# Patient Record
Sex: Female | Born: 1970 | Race: White | Hispanic: No | Marital: Married | Smoking: Never smoker
Health system: Southern US, Community
[De-identification: ages and names within clinical notes are randomized; demographics above are authoritative.]

## PROBLEM LIST (undated history)

## (undated) DIAGNOSIS — I209 Angina pectoris, unspecified: Secondary | ICD-10-CM

## (undated) DIAGNOSIS — E039 Hypothyroidism, unspecified: Secondary | ICD-10-CM

## (undated) DIAGNOSIS — I219 Acute myocardial infarction, unspecified: Secondary | ICD-10-CM

## (undated) DIAGNOSIS — R112 Nausea with vomiting, unspecified: Secondary | ICD-10-CM

## (undated) DIAGNOSIS — I2510 Atherosclerotic heart disease of native coronary artery without angina pectoris: Secondary | ICD-10-CM

## (undated) DIAGNOSIS — N921 Excessive and frequent menstruation with irregular cycle: Secondary | ICD-10-CM

## (undated) DIAGNOSIS — Z9889 Other specified postprocedural states: Secondary | ICD-10-CM

## (undated) DIAGNOSIS — E785 Hyperlipidemia, unspecified: Secondary | ICD-10-CM

## (undated) HISTORY — PX: TONSILLECTOMY: SUR1361

## (undated) HISTORY — PX: HYSTEROSCOPY W/ ENDOMETRIAL ABLATION: SUR665

## (undated) HISTORY — PX: KNEE ARTHROSCOPY: SUR90

---

## 2013-08-02 HISTORY — PX: CORONARY ANGIOPLASTY: SHX604

## 2013-11-30 DIAGNOSIS — I209 Angina pectoris, unspecified: Secondary | ICD-10-CM

## 2013-11-30 HISTORY — DX: Angina pectoris, unspecified: I20.9

## 2014-06-07 ENCOUNTER — Other Ambulatory Visit: Payer: Self-pay

## 2014-06-07 DIAGNOSIS — Z1231 Encounter for screening mammogram for malignant neoplasm of breast: Secondary | ICD-10-CM

## 2014-07-05 ENCOUNTER — Ambulatory Visit
Admission: RE | Admit: 2014-07-05 | Discharge: 2014-07-05 | Disposition: A | Discharge status: Home / Self Care | Payer: BC Managed Care – PPO | Source: Ambulatory Visit

## 2014-07-05 DIAGNOSIS — Z1231 Encounter for screening mammogram for malignant neoplasm of breast: Secondary | ICD-10-CM

## 2015-12-15 ENCOUNTER — Other Ambulatory Visit: Payer: Self-pay

## 2015-12-15 DIAGNOSIS — Z139 Encounter for screening, unspecified: Secondary | ICD-10-CM

## 2015-12-26 ENCOUNTER — Ambulatory Visit
Admission: RE | Admit: 2015-12-26 | Discharge: 2015-12-26 | Disposition: A | Discharge status: Home / Self Care | Payer: BLUE CROSS/BLUE SHIELD | Source: Ambulatory Visit

## 2015-12-26 DIAGNOSIS — Z139 Encounter for screening, unspecified: Secondary | ICD-10-CM

## 2016-04-15 ENCOUNTER — Other Ambulatory Visit: Payer: Self-pay | Admitting: Sports Medicine

## 2016-04-15 DIAGNOSIS — M25552 Pain in left hip: Secondary | ICD-10-CM

## 2016-04-29 ENCOUNTER — Ambulatory Visit: Payer: Self-pay | Admitting: Orthopedic Surgery

## 2016-04-29 NOTE — Progress Notes (Signed)
Pt is being scheduled for preop appt; please place surgical orders in epic. Thanks.  

## 2016-05-04 ENCOUNTER — Encounter (HOSPITAL_COMMUNITY)
Admission: RE | Admit: 2016-05-04 | Discharge: 2016-05-04 | Disposition: A | Discharge status: Home / Self Care | Payer: BLUE CROSS/BLUE SHIELD | Source: Ambulatory Visit | Attending: Orthopedic Surgery | Admitting: Orthopedic Surgery

## 2016-05-04 ENCOUNTER — Encounter (HOSPITAL_COMMUNITY): Payer: Self-pay

## 2016-05-04 DIAGNOSIS — Z7982 Long term (current) use of aspirin: Secondary | ICD-10-CM | POA: Diagnosis not present

## 2016-05-04 DIAGNOSIS — I2510 Atherosclerotic heart disease of native coronary artery without angina pectoris: Secondary | ICD-10-CM | POA: Diagnosis not present

## 2016-05-04 DIAGNOSIS — E039 Hypothyroidism, unspecified: Secondary | ICD-10-CM | POA: Diagnosis not present

## 2016-05-04 DIAGNOSIS — S76012A Strain of muscle, fascia and tendon of left hip, initial encounter: Secondary | ICD-10-CM | POA: Diagnosis not present

## 2016-05-04 DIAGNOSIS — X58XXXA Exposure to other specified factors, initial encounter: Secondary | ICD-10-CM | POA: Diagnosis not present

## 2016-05-04 DIAGNOSIS — Z7902 Long term (current) use of antithrombotics/antiplatelets: Secondary | ICD-10-CM | POA: Diagnosis not present

## 2016-05-04 DIAGNOSIS — I252 Old myocardial infarction: Secondary | ICD-10-CM | POA: Diagnosis not present

## 2016-05-04 DIAGNOSIS — Z955 Presence of coronary angioplasty implant and graft: Secondary | ICD-10-CM | POA: Diagnosis not present

## 2016-05-04 DIAGNOSIS — E785 Hyperlipidemia, unspecified: Secondary | ICD-10-CM | POA: Diagnosis not present

## 2016-05-04 HISTORY — DX: Excessive and frequent menstruation with irregular cycle: N92.1

## 2016-05-04 HISTORY — DX: Hypothyroidism, unspecified: E03.9

## 2016-05-04 HISTORY — DX: Angina pectoris, unspecified: I20.9

## 2016-05-04 HISTORY — DX: Atherosclerotic heart disease of native coronary artery without angina pectoris: I25.10

## 2016-05-04 HISTORY — DX: Hyperlipidemia, unspecified: E78.5

## 2016-05-04 HISTORY — DX: Acute myocardial infarction, unspecified: I21.9

## 2016-05-04 LAB — HCG, SERUM, QUALITATIVE: Preg, Serum: NEGATIVE

## 2016-05-04 LAB — ABO/RH: ABO/RH(D): O POS

## 2016-05-04 NOTE — Patient Instructions (Addendum)
Claire Briggs  05/04/2016   Your procedure is scheduled on: 05/06/16 Report to [facility]  Entrance take [facility]  elevators to 3rd floor to  [place] at 1:45 PM  Call this number if you have problems the morning of surgery [phone]   Remember: ONLY 1 PERSON MAY GO WITH YOU TO SHORT STAY TO GET  READY MORNING OF YOUR SURGERY.  Do not eat food :After Midnight.--- MAY HAVE CLEAR LIQUIDS MORNING OF SURGERY UNTIL 0945 AM--THEN NOTHING BY MOUTH     Take these medicines the morning of surgery with A SIP OF WATER: Levothyroxine May take Nitroglycerin if needed DO NOT TAKE ANY DIABETIC MEDICATIONS DAY OF YOUR SURGERY                               You may not have any metal on your body including hair pins and              piercings  Do not wear jewelry, make-up, lotions, powders or perfumes, deodorant             Do not wear nail polish.  Do not shave  48 hours prior to surgery.              Men may shave face and neck.   Do not bring valuables to the hospital. [place].  Contacts, dentures or bridgework may not be worn into surgery.  Leave suitcase in the car. After surgery it may be brought to your room.   _____________________________________________________________________             [facility] - Preparing for Surgery Before surgery, you can play an important role.  Because skin is not sterile, your skin needs to be as free of germs as possible.  You can reduce the number of germs on your skin by washing with CHG (chlorahexidine gluconate) soap before surgery.  CHG is an antiseptic cleaner which kills germs and bonds with the skin to continue killing germs even after washing. Please DO NOT use if you have an allergy to CHG or antibacterial soaps.  If your skin becomes reddened/irritated stop using the CHG and inform your nurse when you arrive at Short Stay. Do not shave (including legs and underarms)  for at least 48 hours prior to the first CHG shower.  You may shave your face/neck. Please follow these instructions carefully:  1.  Shower with CHG Soap the night before surgery and the  morning of Surgery.  2.  If you choose to wash your hair, wash your hair first as usual with your  normal  shampoo.  3.  After you shampoo, rinse your hair and body thoroughly to remove the  shampoo.                           4.  Use CHG as you would any other liquid soap.  You can apply chg directly  to the skin and wash                       Gently with a scrungie or clean washcloth.  5.  Apply the CHG Soap to your body  ONLY FROM THE NECK DOWN.   Do not use on face/ open                           Wound or open sores. Avoid contact with eyes, ears mouth and genitals (private parts).                       Wash face,  Genitals (private parts) with your normal soap.             6.  Wash thoroughly, paying special attention to the area where your surgery  will be performed.  7.  Thoroughly rinse your body with warm water from the neck down.  8.  DO NOT shower/wash with your normal soap after using and rinsing off  the CHG Soap.                9.  Pat yourself dry with a clean towel.            10.  Wear clean pajamas.            11.  Place clean sheets on your bed the night of your first shower and do not  sleep with pets. Day of Surgery : Do not apply any lotions/deodorants the morning of surgery.  Please wear clean clothes to the hospital/surgery center.  FAILURE TO FOLLOW THESE INSTRUCTIONS MAY RESULT IN THE CANCELLATION OF YOUR SURGERY PATIENT SIGNATURE_________________________________  NURSE SIGNATURE__________________________________  ________________________________________________________________________    CLEAR LIQUID DIET   Foods Allowed                                                                     Foods Excluded  Coffee and tea, regular and decaf                             liquids  that you cannot  Plain Jell-O in any flavor                                             see through such as: Fruit ices (not with fruit pulp)                                     milk, soups, orange juice  Iced Popsicles                                    All solid food Carbonated beverages, regular and diet                                    Cranberry, grape and apple juices Sports drinks like Gatorade Lightly seasoned clear broth or consume(fat free) Sugar, honey syrup  Sample Menu Breakfast  Lunch                                     Supper Cranberry juice                    Beef broth                            Chicken broth Jell-O                                     Grape juice                           Apple juice Coffee or tea                        Jell-O                                      Popsicle                                                Coffee or tea                        Coffee or tea  _____________________________________________________________________

## 2016-05-04 NOTE — Progress Notes (Signed)
Addendum to previous note-  ekg 9/17 chart, requested urgent request from [facility] for resting EKG prior to stress eccho. Cbc with diff 04/23/16, cmp 04/23/16, coagulation studies on chart- delivered by pt.clearance T McAddoo NP, and cardiac clearance on chart

## 2016-05-04 NOTE — Progress Notes (Signed)
LOV with clearance Dr Jimmie Molly on chart, ekg 1/17, stress  eccho 10/16 on chart

## 2016-05-04 NOTE — Progress Notes (Signed)
Resting ekg prior to stress eccho received from [facility] and placed on chart

## 2016-05-06 ENCOUNTER — Encounter (HOSPITAL_COMMUNITY)
Admission: RE | Disposition: A | Discharge status: Home / Self Care | Payer: Self-pay | Source: Ambulatory Visit | Attending: Orthopedic Surgery

## 2016-05-06 ENCOUNTER — Encounter (HOSPITAL_COMMUNITY): Payer: Self-pay | Admitting: *Deleted

## 2016-05-06 ENCOUNTER — Observation Stay (HOSPITAL_COMMUNITY)
Admission: RE | Admit: 2016-05-06 | Discharge: 2016-05-07 | Disposition: A | Discharge status: Home / Self Care | Payer: BLUE CROSS/BLUE SHIELD | Source: Ambulatory Visit | Attending: Orthopedic Surgery | Admitting: Orthopedic Surgery

## 2016-05-06 ENCOUNTER — Ambulatory Visit (HOSPITAL_COMMUNITY): Payer: BLUE CROSS/BLUE SHIELD | Admitting: Anesthesiology

## 2016-05-06 DIAGNOSIS — Z7902 Long term (current) use of antithrombotics/antiplatelets: Secondary | ICD-10-CM | POA: Insufficient documentation

## 2016-05-06 DIAGNOSIS — S76012A Strain of muscle, fascia and tendon of left hip, initial encounter: Secondary | ICD-10-CM | POA: Diagnosis not present

## 2016-05-06 DIAGNOSIS — X58XXXA Exposure to other specified factors, initial encounter: Secondary | ICD-10-CM | POA: Insufficient documentation

## 2016-05-06 DIAGNOSIS — E039 Hypothyroidism, unspecified: Secondary | ICD-10-CM | POA: Insufficient documentation

## 2016-05-06 DIAGNOSIS — I252 Old myocardial infarction: Secondary | ICD-10-CM | POA: Insufficient documentation

## 2016-05-06 DIAGNOSIS — M67952 Unspecified disorder of synovium and tendon, left thigh: Secondary | ICD-10-CM

## 2016-05-06 DIAGNOSIS — I2510 Atherosclerotic heart disease of native coronary artery without angina pectoris: Secondary | ICD-10-CM | POA: Insufficient documentation

## 2016-05-06 DIAGNOSIS — Z7982 Long term (current) use of aspirin: Secondary | ICD-10-CM | POA: Insufficient documentation

## 2016-05-06 DIAGNOSIS — E785 Hyperlipidemia, unspecified: Secondary | ICD-10-CM | POA: Insufficient documentation

## 2016-05-06 DIAGNOSIS — Z955 Presence of coronary angioplasty implant and graft: Secondary | ICD-10-CM | POA: Insufficient documentation

## 2016-05-06 HISTORY — PX: EXCISION/RELEASE BURSA HIP: SHX5014

## 2016-05-06 LAB — TYPE AND SCREEN
ABO/RH(D): O POS
ANTIBODY SCREEN: NEGATIVE

## 2016-05-06 SURGERY — RELEASE, BURSA, TROCHANTERIC
Anesthesia: General | Site: Hip | Laterality: Left

## 2016-05-06 MED ORDER — SCOPOLAMINE 1 MG/3DAYS TD PT72
MEDICATED_PATCH | TRANSDERMAL | Status: AC
  Filled 2016-05-06: qty 1

## 2016-05-06 MED ORDER — BUPIVACAINE HCL (PF) 0.25 % IJ SOLN
INTRAMUSCULAR | Status: AC
  Filled 2016-05-06: qty 30

## 2016-05-06 MED ORDER — CEFAZOLIN SODIUM-DEXTROSE 2-4 GM/100ML-% IV SOLN
INTRAVENOUS | Status: AC
  Filled 2016-05-06: qty 100

## 2016-05-06 MED ORDER — HYDROMORPHONE HCL 1 MG/ML IJ SOLN
0.2500 mg | INTRAMUSCULAR | Status: DC | PRN
  Administered 2016-05-06 (×4): 0.5 mg via INTRAVENOUS

## 2016-05-06 MED ORDER — HYDROCODONE-ACETAMINOPHEN 5-325 MG PO TABS
1.0000 | ORAL_TABLET | ORAL | Status: DC | PRN
  Administered 2016-05-06 – 2016-05-07 (×2): 1 via ORAL
  Administered 2016-05-07: 2 via ORAL
  Administered 2016-05-07: 1 via ORAL
  Administered 2016-05-07: 2 via ORAL
  Filled 2016-05-06: qty 1
  Filled 2016-05-06: qty 2
  Filled 2016-05-06: qty 1
  Filled 2016-05-06 (×3): qty 2

## 2016-05-06 MED ORDER — PROPOFOL 10 MG/ML IV BOLUS
INTRAVENOUS | Status: AC
  Filled 2016-05-06: qty 20

## 2016-05-06 MED ORDER — HYDROMORPHONE HCL 1 MG/ML IJ SOLN
INTRAMUSCULAR | Status: AC
  Administered 2016-05-06: 0.5 mg via INTRAVENOUS
  Filled 2016-05-06: qty 1

## 2016-05-06 MED ORDER — ROCURONIUM BROMIDE 10 MG/ML (PF) SYRINGE
PREFILLED_SYRINGE | INTRAVENOUS | Status: DC | PRN
  Administered 2016-05-06: 50 mg via INTRAVENOUS

## 2016-05-06 MED ORDER — LACTATED RINGERS IV SOLN
INTRAVENOUS | Status: DC | PRN
  Administered 2016-05-06 (×2): via INTRAVENOUS

## 2016-05-06 MED ORDER — LACTATED RINGERS IV SOLN: INTRAVENOUS | Status: DC

## 2016-05-06 MED ORDER — DOCUSATE SODIUM 100 MG PO CAPS
100.0000 mg | ORAL_CAPSULE | Freq: Two times a day (BID) | ORAL | Status: DC
  Administered 2016-05-06 – 2016-05-07 (×2): 100 mg via ORAL
  Filled 2016-05-06 (×2): qty 1

## 2016-05-06 MED ORDER — CEFAZOLIN SODIUM-DEXTROSE 2-4 GM/100ML-% IV SOLN
2.0000 g | INTRAVENOUS | Status: AC
  Administered 2016-05-06: 2 g via INTRAVENOUS
  Filled 2016-05-06: qty 100

## 2016-05-06 MED ORDER — ACETAMINOPHEN 650 MG RE SUPP: 650.0000 mg | Freq: Four times a day (QID) | RECTAL | Status: DC | PRN

## 2016-05-06 MED ORDER — LIDOCAINE 2% (20 MG/ML) 5 ML SYRINGE
INTRAMUSCULAR | Status: DC | PRN
  Administered 2016-05-06: 60 mg via INTRAVENOUS

## 2016-05-06 MED ORDER — POVIDONE-IODINE 10 % EX SWAB
2.0000 "application " | Freq: Once | CUTANEOUS | Status: AC
  Administered 2016-05-06: 2 via TOPICAL

## 2016-05-06 MED ORDER — METHOCARBAMOL 1000 MG/10ML IJ SOLN
500.0000 mg | Freq: Four times a day (QID) | INTRAMUSCULAR | Status: DC | PRN
  Filled 2016-05-06: qty 5

## 2016-05-06 MED ORDER — SCOPOLAMINE 1 MG/3DAYS TD PT72
1.0000 | MEDICATED_PATCH | TRANSDERMAL | Status: DC
  Administered 2016-05-06: 1.5 mg via TRANSDERMAL

## 2016-05-06 MED ORDER — SENNA 8.6 MG PO TABS
1.0000 | ORAL_TABLET | Freq: Two times a day (BID) | ORAL | Status: DC
  Administered 2016-05-06 – 2016-05-07 (×2): 8.6 mg via ORAL
  Filled 2016-05-06 (×2): qty 1

## 2016-05-06 MED ORDER — MIDAZOLAM HCL 2 MG/2ML IJ SOLN
INTRAMUSCULAR | Status: AC
  Filled 2016-05-06: qty 2

## 2016-05-06 MED ORDER — CLOPIDOGREL BISULFATE 75 MG PO TABS
75.0000 mg | ORAL_TABLET | Freq: Every day | ORAL | Status: DC
  Administered 2016-05-07: 75 mg via ORAL
  Filled 2016-05-06: qty 1

## 2016-05-06 MED ORDER — EPHEDRINE SULFATE 50 MG/ML IJ SOLN
INTRAMUSCULAR | Status: DC | PRN
  Administered 2016-05-06: 5 mg via INTRAVENOUS

## 2016-05-06 MED ORDER — ASPIRIN EC 81 MG PO TBEC
81.0000 mg | DELAYED_RELEASE_TABLET | Freq: Every day | ORAL | Status: DC
  Administered 2016-05-07: 81 mg via ORAL
  Filled 2016-05-06: qty 1

## 2016-05-06 MED ORDER — LISINOPRIL 5 MG PO TABS: 5.0000 mg | ORAL_TABLET | Freq: Every day | ORAL | Status: DC

## 2016-05-06 MED ORDER — BUPIVACAINE HCL 0.25 % IJ SOLN
INTRAMUSCULAR | Status: DC | PRN
  Administered 2016-05-06: 30 mL

## 2016-05-06 MED ORDER — KETOROLAC TROMETHAMINE 15 MG/ML IJ SOLN
15.0000 mg | Freq: Four times a day (QID) | INTRAMUSCULAR | Status: DC
  Administered 2016-05-06 – 2016-05-07 (×3): 15 mg via INTRAVENOUS
  Filled 2016-05-06 (×3): qty 1

## 2016-05-06 MED ORDER — ONDANSETRON HCL 4 MG/2ML IJ SOLN
INTRAMUSCULAR | Status: DC | PRN
  Administered 2016-05-06: 4 mg via INTRAVENOUS

## 2016-05-06 MED ORDER — SODIUM CHLORIDE 0.9 % IV SOLN: INTRAVENOUS | Status: DC

## 2016-05-06 MED ORDER — METOCLOPRAMIDE HCL 5 MG/ML IJ SOLN: 5.0000 mg | Freq: Three times a day (TID) | INTRAMUSCULAR | Status: DC | PRN

## 2016-05-06 MED ORDER — NITROGLYCERIN 0.4 MG SL SUBL: 0.4000 mg | SUBLINGUAL_TABLET | SUBLINGUAL | Status: DC | PRN

## 2016-05-06 MED ORDER — METOCLOPRAMIDE HCL 5 MG PO TABS: 5.0000 mg | ORAL_TABLET | Freq: Three times a day (TID) | ORAL | Status: DC | PRN

## 2016-05-06 MED ORDER — ONDANSETRON HCL 4 MG/2ML IJ SOLN: 4.0000 mg | Freq: Four times a day (QID) | INTRAMUSCULAR | Status: DC | PRN

## 2016-05-06 MED ORDER — POLYETHYLENE GLYCOL 3350 17 G PO PACK: 17.0000 g | PACK | Freq: Every day | ORAL | Status: DC | PRN

## 2016-05-06 MED ORDER — CEFAZOLIN SODIUM-DEXTROSE 2-4 GM/100ML-% IV SOLN
2.0000 g | Freq: Four times a day (QID) | INTRAVENOUS | Status: AC
  Administered 2016-05-06 – 2016-05-07 (×3): 2 g via INTRAVENOUS
  Filled 2016-05-06 (×3): qty 100

## 2016-05-06 MED ORDER — CHLORHEXIDINE GLUCONATE 4 % EX LIQD: 60.0000 mL | Freq: Once | CUTANEOUS | Status: DC

## 2016-05-06 MED ORDER — FENTANYL CITRATE (PF) 250 MCG/5ML IJ SOLN
INTRAMUSCULAR | Status: AC
  Filled 2016-05-06: qty 5

## 2016-05-06 MED ORDER — ONDANSETRON HCL 4 MG PO TABS: 4.0000 mg | ORAL_TABLET | Freq: Four times a day (QID) | ORAL | Status: DC | PRN

## 2016-05-06 MED ORDER — SODIUM CHLORIDE 0.9 % IV SOLN
INTRAVENOUS | Status: DC
  Administered 2016-05-06: 20:00:00 via INTRAVENOUS

## 2016-05-06 MED ORDER — SUGAMMADEX SODIUM 200 MG/2ML IV SOLN
INTRAVENOUS | Status: AC
  Filled 2016-05-06: qty 2

## 2016-05-06 MED ORDER — SUGAMMADEX SODIUM 200 MG/2ML IV SOLN
INTRAVENOUS | Status: DC | PRN
  Administered 2016-05-06: 175 mg via INTRAVENOUS

## 2016-05-06 MED ORDER — ONDANSETRON HCL 4 MG/2ML IJ SOLN
INTRAMUSCULAR | Status: AC
  Filled 2016-05-06: qty 2

## 2016-05-06 MED ORDER — ACETAMINOPHEN 325 MG PO TABS: 650.0000 mg | ORAL_TABLET | Freq: Four times a day (QID) | ORAL | Status: DC | PRN

## 2016-05-06 MED ORDER — HYDROMORPHONE HCL 1 MG/ML IJ SOLN: 0.5000 mg | INTRAMUSCULAR | Status: DC | PRN

## 2016-05-06 MED ORDER — FENTANYL CITRATE (PF) 250 MCG/5ML IJ SOLN
INTRAMUSCULAR | Status: DC | PRN
  Administered 2016-05-06 (×5): 50 ug via INTRAVENOUS

## 2016-05-06 MED ORDER — METHOCARBAMOL 500 MG PO TABS
500.0000 mg | ORAL_TABLET | Freq: Four times a day (QID) | ORAL | Status: DC | PRN
  Administered 2016-05-06 – 2016-05-07 (×2): 500 mg via ORAL
  Filled 2016-05-06 (×2): qty 1

## 2016-05-06 MED ORDER — MIDAZOLAM HCL 2 MG/2ML IJ SOLN
INTRAMUSCULAR | Status: DC | PRN
Start: 2016-05-06 — End: 2016-05-06
  Administered 2016-05-06: 2 mg via INTRAVENOUS

## 2016-05-06 MED ORDER — MEPERIDINE HCL 50 MG/ML IJ SOLN: 6.2500 mg | INTRAMUSCULAR | Status: DC | PRN

## 2016-05-06 MED ORDER — PROMETHAZINE HCL 25 MG/ML IJ SOLN: 6.2500 mg | INTRAMUSCULAR | Status: DC | PRN

## 2016-05-06 MED ORDER — PROPOFOL 10 MG/ML IV BOLUS
INTRAVENOUS | Status: DC | PRN
  Administered 2016-05-06: 150 mg via INTRAVENOUS

## 2016-05-06 MED ORDER — LEVOTHYROXINE SODIUM 50 MCG PO TABS
50.0000 ug | ORAL_TABLET | Freq: Every day | ORAL | Status: DC
  Administered 2016-05-07: 50 ug via ORAL
  Filled 2016-05-06: qty 1

## 2016-05-06 MED ORDER — ATORVASTATIN CALCIUM 20 MG PO TABS
40.0000 mg | ORAL_TABLET | Freq: Every evening | ORAL | Status: DC
  Administered 2016-05-06: 40 mg via ORAL
  Filled 2016-05-06: qty 2

## 2016-05-06 MED ORDER — DEXAMETHASONE SODIUM PHOSPHATE 10 MG/ML IJ SOLN: INTRAMUSCULAR | Status: DC | PRN

## 2016-05-06 MED ORDER — DEXAMETHASONE SODIUM PHOSPHATE 10 MG/ML IJ SOLN
INTRAMUSCULAR | Status: DC | PRN
  Administered 2016-05-06: 10 mg via INTRAVENOUS

## 2016-05-06 SURGICAL SUPPLY — 48 items
ANCHOR SUT QUATTRO KNTLS 4.5 (Anchor) ×6 IMPLANT
BAG ZIPLOCK 12X15 (MISCELLANEOUS) ×3 IMPLANT
DERMABOND ADVANCED (GAUZE/BANDAGES/DRESSINGS) ×4
DERMABOND ADVANCED .7 DNX12 (GAUZE/BANDAGES/DRESSINGS) ×2 IMPLANT
DRAPE INCISE IOBAN 85X60 (DRAPES) ×3 IMPLANT
DRAPE ORTHO SPLIT 77X108 STRL (DRAPES) ×4
DRAPE SURG 17X11 SM STRL (DRAPES) ×3 IMPLANT
DRAPE SURG ORHT 6 SPLT 77X108 (DRAPES) ×2 IMPLANT
DRAPE U-SHAPE 47X51 STRL (DRAPES) ×3 IMPLANT
DRSG AQUACEL AG ADV 3.5X10 (GAUZE/BANDAGES/DRESSINGS) ×3 IMPLANT
DURAPREP 26ML APPLICATOR (WOUND CARE) ×3 IMPLANT
ELECT REM PT RETURN 9FT ADLT (ELECTROSURGICAL) ×3
ELECTRODE REM PT RTRN 9FT ADLT (ELECTROSURGICAL) ×1 IMPLANT
EVACUATOR 1/8 PVC DRAIN (DRAIN) ×3 IMPLANT
GAUZE SPONGE 2X2 8PLY STRL LF (GAUZE/BANDAGES/DRESSINGS) ×1 IMPLANT
GLOVE BIOGEL PI IND STRL 7.5 (GLOVE) ×1 IMPLANT
GLOVE BIOGEL PI IND STRL 8.5 (GLOVE) ×1 IMPLANT
GLOVE BIOGEL PI INDICATOR 7.5 (GLOVE) ×2
GLOVE BIOGEL PI INDICATOR 8.5 (GLOVE) ×2
GLOVE ECLIPSE 8.0 STRL XLNG CF (GLOVE) IMPLANT
GLOVE ORTHO TXT STRL SZ7.5 (GLOVE) ×6 IMPLANT
GLOVE SURG ORTHO 8.0 STRL STRW (GLOVE) ×3 IMPLANT
GOWN SPEC L3 XXLG W/TWL (GOWN DISPOSABLE) ×6 IMPLANT
GOWN STRL REUS W/TWL LRG LVL3 (GOWN DISPOSABLE) ×3 IMPLANT
HANDPIECE INTERPULSE COAX TIP (DISPOSABLE) ×2
IV NS IRRIG 3000ML ARTHROMATIC (IV SOLUTION) ×3 IMPLANT
KIT BASIN OR (CUSTOM PROCEDURE TRAY) ×3 IMPLANT
MANIFOLD NEPTUNE II (INSTRUMENTS) ×3 IMPLANT
MARKER SKIN DUAL TIP RULER LAB (MISCELLANEOUS) ×3 IMPLANT
NEEDLE MAYO 6 CRC TAPER PT (NEEDLE) ×3 IMPLANT
NEEDLE SPNL 18GX3.5 QUINCKE PK (NEEDLE) ×3 IMPLANT
PACK TOTAL JOINT (CUSTOM PROCEDURE TRAY) ×3 IMPLANT
PILLOW ABDUCTION HIP (SOFTGOODS) ×3 IMPLANT
POSITIONER SURGICAL ARM (MISCELLANEOUS) ×3 IMPLANT
SET HNDPC FAN SPRY TIP SCT (DISPOSABLE) ×1 IMPLANT
SPONGE GAUZE 2X2 STER 10/PKG (GAUZE/BANDAGES/DRESSINGS) ×2
STAPLER VISISTAT 35W (STAPLE) ×3 IMPLANT
SUT MNCRL AB 4-0 PS2 18 (SUTURE) ×3 IMPLANT
SUT MON AB 2-0 CT1 36 (SUTURE) ×3 IMPLANT
SUT VIC AB 1 CT1 36 (SUTURE) ×6 IMPLANT
SUT VIC AB 2-0 CT1 27 (SUTURE) ×6
SUT VIC AB 2-0 CT1 TAPERPNT 27 (SUTURE) ×3 IMPLANT
SUT VLOC 180 0 24IN GS25 (SUTURE) ×3 IMPLANT
SWAB COLLECTION DEVICE MRSA (MISCELLANEOUS) ×3 IMPLANT
SWAB CULTURE ESWAB REG 1ML (MISCELLANEOUS) ×3 IMPLANT
SYR 30ML LL (SYRINGE) ×3 IMPLANT
TAPE FIBER 2MM 7IN #2 BLUE (SUTURE) ×6 IMPLANT
TOWEL OR 17X26 10 PK STRL BLUE (TOWEL DISPOSABLE) ×6 IMPLANT

## 2016-05-06 NOTE — Brief Op Note (Signed)
05/06/2016  6:42 PM  PATIENT:  Claire Briggs  45 y.o. female  PRE-OPERATIVE DIAGNOSIS:  abductor tendon tear left hip  POST-OPERATIVE DIAGNOSIS:  abductor tendon tear left hip  PROCEDURE:  Procedure(s): LEFT HIP ABDUCTOR REPAIR (Left)  SURGEON:  Surgeon(s) and Role:    * Rod Can, MD - Primary    * Nicholes Stairs, MD - Assisting  PHYSICIAN ASSISTANT: none  ASSISTANTS: jason rogers, md   ANESTHESIA:   general  EBL:  Total I/O In: 1100 [I.V.:1100] Out: 50 [Blood:50]  BLOOD ADMINISTERED:none  DRAINS: none   LOCAL MEDICATIONS USED:  MARCAINE     SPECIMEN:  No Specimen  DISPOSITION OF SPECIMEN:  N/A  COUNTS:  YES  TOURNIQUET:  * No tourniquets in log *  DICTATION: .Other Dictation: Dictation Number [id number]  PLAN OF CARE: Admit for overnight observation  PATIENT DISPOSITION:  PACU - hemodynamically stable.   Delay start of Pharmacological VTE agent (>24hrs) due to surgical blood loss or risk of bleeding: no

## 2016-05-06 NOTE — H&P (Signed)
PREOPERATIVE H&P  Chief Complaint: abductor tendon tear left hip  HPI: Claire Briggs is a 45 y.o. female who presents for preoperative history and physical with a diagnosis of abductor tendon tear left hip. Symptoms are rated as moderate to severe, and have been worsening.  This is significantly impairing activities of daily living.  She has elected for surgical management.   Past Medical History:  Diagnosis Date  . Anginal pain ([place])   . ASCVD (arteriosclerotic cardiovascular disease)   . Coronary artery disease   . Dyslipidemia   . Hypothyroidism   . Menometrorrhagia   . Myocardial infarction    Past Surgical History:  Procedure Laterality Date  . CORONARY ANGIOPLASTY  2015   stents x 2  . HYSTEROSCOPY W/ ENDOMETRIAL ABLATION    . KNEE ARTHROSCOPY Left   . TONSILLECTOMY     Social History   Social History  . Marital status: Married    Spouse name: N/A  . Number of children: N/A  . Years of education: N/A   Social History Main Topics  . Smoking status: Never Smoker  . Smokeless tobacco: Never Used  . Alcohol use No  . Drug use: No  . Sexual activity: Not Asked   Other Topics Concern  . None   Social History Narrative  . None   History reviewed. No pertinent family history. No Known Allergies Prior to Admission medications   Medication Sig Start Date End Date Taking? Authorizing Provider  atorvastatin (LIPITOR) 40 MG tablet Take 40 mg by mouth every evening. 03/22/16  Yes Historical Provider, MD  levothyroxine (SYNTHROID, LEVOTHROID) 50 MCG tablet Take 50 mcg by mouth daily before breakfast. 04/26/16  Yes Historical Provider, MD  lisinopril (PRINIVIL,ZESTRIL) 5 MG tablet Take 5 mg by mouth daily. 03/22/16  Yes Historical Provider, MD  naproxen sodium (ANAPROX) 220 MG tablet Take 220 mg by mouth 2 (two) times daily as needed (for pain.).   Yes Historical Provider, MD  aspirin EC 81 MG tablet Take 81 mg by mouth daily.    Historical Provider, MD  clopidogrel (PLAVIX)  75 MG tablet Take 75 mg by mouth daily. 02/25/16   Historical Provider, MD  ibuprofen (ADVIL,MOTRIN) 200 MG tablet Take 600 mg by mouth 2 (two) times daily as needed (for pain.).    Historical Provider, MD  nitroGLYCERIN (NITROSTAT) 0.4 MG SL tablet Place 0.4 mg under the tongue every 5 (five) minutes x 3 doses as needed. For chest pain    Historical Provider, MD  Turmeric 500 MG CAPS Take 500 mg by mouth every evening.    Historical Provider, MD     Positive ROS: All other systems have been reviewed and were otherwise negative with the exception of those mentioned in the HPI and as above.  Physical Exam: General: Alert, no acute distress Cardiovascular: No pedal edema Respiratory: No cyanosis, no use of accessory musculature GI: No organomegaly, abdomen is soft and non-tender Skin: No lesions in the area of chief complaint Neurologic: Sensation intact distally Psychiatric: Patient is competent for consent with normal mood and affect Lymphatic: No axillary or cervical lymphadenopathy  MUSCULOSKELETAL: L hip skin intact. 4/5 abductor strength with trochanteric TTP. NVI distally.  Assessment: abductor tendon tear left hip  Plan: Plan for Procedure(s): LEFT HIP ABDUCTOR REPAIR  The risks benefits and alternatives were discussed with the patient including but not limited to the risks of nonoperative treatment, versus surgical intervention including infection, bleeding, nerve injury,  blood clots, cardiopulmonary complications, morbidity, mortality, among  others, and they were willing to proceed.   Izabella Marcantel, Horald Pollen, MD Cell [phone]   05/06/2016 3:58 PM

## 2016-05-06 NOTE — Anesthesia Postprocedure Evaluation (Signed)
Anesthesia Post Note  Patient: Damali Broadfoot  Procedure(s) Performed: Procedure(s) (LRB): LEFT HIP ABDUCTOR REPAIR (Left)  Patient location during evaluation: PACU Anesthesia Type: General Level of consciousness: awake and alert Pain management: pain level controlled Vital Signs Assessment: post-procedure vital signs reviewed and stable Respiratory status: spontaneous breathing, nonlabored ventilation, respiratory function stable and patient connected to nasal cannula oxygen Cardiovascular status: blood pressure returned to baseline and stable Postop Assessment: no signs of nausea or vomiting Anesthetic complications: no    Last Vitals:  Vitals:   05/06/16 1900 05/06/16 1915  BP: 114/70 106/75  Pulse: 61 63  Resp: 13 12  Temp:      Last Pain:  Vitals:   05/06/16 1915  TempSrc:   PainSc: Asleep    LLE Motor Response: Purposeful movement (05/06/16 1915) LLE Sensation: Full sensation (05/06/16 1915)          Effie Berkshire

## 2016-05-06 NOTE — Anesthesia Procedure Notes (Signed)
Procedure Name: Intubation Date/Time: 05/06/2016 4:56 PM Performed by: Cynda Familia Pre-anesthesia Checklist: Patient identified, Emergency Drugs available, Suction available and Patient being monitored Patient Re-evaluated:Patient Re-evaluated prior to inductionOxygen Delivery Method: Circle System Utilized Preoxygenation: Pre-oxygenation with 100% oxygen Intubation Type: IV induction Ventilation: Mask ventilation without difficulty Laryngoscope Size: Miller and 2 Grade View: Grade I Tube type: Oral Tube size: 7.0 mm Number of attempts: 1 Airway Equipment and Method: Stylet Placement Confirmation: ETT inserted through vocal cords under direct vision,  positive ETCO2 and breath sounds checked- equal and bilateral Secured at: 22 cm Tube secured with: Tape Dental Injury: Teeth and Oropharynx as per pre-operative assessment  Comments: Smooth IV induction--  intubation AM CRNA atraumatic--- teeth and mouth as preop-- bilat BS [employer]

## 2016-05-06 NOTE — Anesthesia Preprocedure Evaluation (Addendum)
Anesthesia Evaluation  Patient identified by MRN, date of birth, ID band Patient awake    Reviewed: Allergy & Precautions, NPO status , Patient's Chart, lab work & pertinent test results  Airway Mallampati: I  TM Distance: >3 FB Neck ROM: Full    Dental  (+) Teeth Intact, Dental Advisory Given   Pulmonary neg pulmonary ROS,    breath sounds clear to auscultation       Cardiovascular hypertension, Pt. on medications + CAD, + Past MI and + Cardiac Stents   Rhythm:Regular Rate:Normal     Neuro/Psych negative neurological ROS  negative psych ROS   GI/Hepatic negative GI ROS, Neg liver ROS,   Endo/Other  Hypothyroidism   Renal/GU negative Renal ROS  negative genitourinary   Musculoskeletal negative musculoskeletal ROS (+)   Abdominal   Peds negative pediatric ROS (+)  Hematology negative hematology ROS (+)   Anesthesia Other Findings   Reproductive/Obstetrics negative OB ROS                           Anesthesia Physical Anesthesia Plan  ASA: II  Anesthesia Plan: General   Post-op Pain Management:    Induction: Intravenous  Airway Management Planned: Oral ETT  Additional Equipment:   Intra-op Plan:   Post-operative Plan: Extubation in OR  Informed Consent: I have reviewed the patients History and Physical, chart, labs and discussed the procedure including the risks, benefits and alternatives for the proposed anesthesia with the patient or authorized representative who has indicated his/her understanding and acceptance.   Dental advisory given  Plan Discussed with: CRNA  Anesthesia Plan Comments:        Anesthesia Quick Evaluation

## 2016-05-06 NOTE — Transfer of Care (Signed)
Immediate Anesthesia Transfer of Care Note  Patient: Claire Briggs  Procedure(s) Performed: Procedure(s): LEFT HIP ABDUCTOR REPAIR (Left)  Patient Location: PACU  Anesthesia Type:General  Level of Consciousness: awake and alert   Airway & Oxygen Therapy: Patient Spontanous Breathing and Patient connected to face mask oxygen  Post-op Assessment: Report given to RN and Post -op Vital signs reviewed and stable  Post vital signs: Reviewed and stable  Last Vitals:  Vitals:   05/06/16 1331  BP: (!) 125/91  Pulse: 65  Resp: 16  Temp: 36.9 C    Last Pain:  Vitals:   05/06/16 1331  TempSrc: Oral      Patients Stated Pain Goal: 3 (29/51/88 4166)  Complications: No apparent anesthesia complications

## 2016-05-06 NOTE — Discharge Instructions (Signed)
Dr. Rod Can [facility] [address], [place], [place] [address] [phone]  Bursectomy / Gluteal Tendon Repair Discharge Instructions  HOME CARE INSTRUCTIONS  Remove items at home which could result in a fall. This includes throw rugs or furniture in walking pathways.   ICE to the affected hip every three hours for 30 minutes at a time and then as needed for pain and swelling.  Continue to use ice on the hip for pain and swelling from surgery. You may notice swelling that will progress down to the foot and ankle.  This is normal after surgery.  Elevate the leg when you are not up walking on it.    Continue to use the breathing machine which will help keep your temperature down.  It is common for your temperature to cycle up and down following surgery, especially at night when you are not up moving around and exerting yourself.  The breathing machine keeps your lungs expanded and your temperature down. Sit on high chairs which makes it easier to stand.  Sit on chairs with arms. Use the chair arms to help push yourself up when arising.   No Active Abduction of the leg (No pulling leg out to the side away from the body).  Use your walker for first several days until comfortable ambulating.  Wear abduction brace at all times except for showering.  DIET You may resume your previous home diet once your are discharged from the hospital.  DRESSING / WOUND CARE / SHOWERING Keep the surgical dressing until follow up.  The dressing is water proof, so you can shower without any extra covering.  IF THE DRESSING FALLS OFF or the wound gets wet inside, change the dressing with sterile gauze.  Please use good hand washing techniques before changing the dressing.  Do not use any lotions or creams on the incision until instructed by your surgeon.    ACTIVITY Walk with your walker as instructed. Use walker as long as suggested by your  caregivers. Avoid periods of inactivity such as sitting longer than an hour when not asleep. This helps prevent blood clots.  You may resume a sexual relationship in one month or when given the OK by your doctor.  You may return to work once you are cleared by your doctor.  Do not drive a car for 6 weeks or until released by you surgeon.  Do not drive while taking narcotics.  WEIGHT BEARING Weight bearing as tolerated with assist device (walker, cane, etc) as directed, use it as long as suggested by your surgeon or therapist, typically at least 4-6 weeks.  POSTOPERATIVE CONSTIPATION PROTOCOL Constipation - defined medically as fewer than three stools per week and severe constipation as less than one stool per week.  One of the most common issues patients have following surgery is constipation.  Even if you have a regular bowel pattern at home, your normal regimen is likely to be disrupted due to multiple reasons following surgery.  Combination of anesthesia, postoperative narcotics, change in appetite and fluid intake all can affect your bowels.  In order to avoid complications following surgery, here are some recommendations in order to help you during your recovery period.  Colace (docusate) - Pick up an over-the-counter form of Colace or another stool softener and take twice a day as long as you are requiring postoperative pain medications.  Take with a full glass of water daily.  If you experience loose stools or  diarrhea, hold the colace until you stool forms back up.  If your symptoms do not get better within 1 week or if they get worse, check with your doctor.  Dulcolax (bisacodyl) - Pick up over-the-counter and take as directed by the product packaging as needed to assist with the movement of your bowels.  Take with a full glass of water.  Use this product as needed if not relieved by Colace only.   MiraLax (polyethylene glycol) - Pick up over-the-counter to have on hand.  MiraLax is a  solution that will increase the amount of water in your bowels to assist with bowel movements.  Take as directed and can mix with a glass of water, juice, soda, coffee, or tea.  Take if you go more than two days without a movement. Do not use MiraLax more than once per day. Call your doctor if you are still constipated or irregular after using this medication for 7 days in a row.  If you continue to have problems with postoperative constipation, please contact the office for further assistance and recommendations.  If you experience "the worst abdominal pain ever" or develop nausea or vomiting, please contact the office immediatly for further recommendations for treatment.  ITCHING  If you experience itching with your medications, try taking only a single pain pill, or even half a pain pill at a time.  You can also use Benadryl over the counter for itching or also to help with sleep.   TED HOSE STOCKINGS Wear the elastic stockings on both legs for three weeks following surgery during the day but you may remove then at night for sleeping.  MEDICATIONS See your medication summary on the After Visit Summary that the nursing staff will review with you prior to discharge.  You may have some home medications which will be placed on hold until you complete the course of blood thinner medication.  It is important for you to complete the blood thinner medication as prescribed by your surgeon.  Continue your approved medications as instructed at time of discharge.  PRECAUTIONS If you experience chest pain or shortness of breath - call 911 immediately for transfer to the hospital emergency department.  If you develop a fever greater that 101 F, purulent drainage from wound, increased redness or drainage from wound, foul odor from the wound/dressing, or calf pain - CONTACT YOUR SURGEON.                                                   FOLLOW-UP APPOINTMENTS Make sure you keep all of your appointments after  your operation with your surgeon and caregivers. You should call the office at the above phone number and make an appointment for approximately two weeks after the date of your surgery or on the date instructed by your surgeon outlined in the "After Visit Summary".   Pick up stool softner and laxative for home use following surgery while on pain medications. Do not submerge dressing under water. Wear brace at all times. No ACTIVE hip abduction for 6 weeks. Continue to use ice for pain and swelling after surgery. Do not use any lotions or creams on the incision until instructed by your surgeon.

## 2016-05-07 ENCOUNTER — Encounter (HOSPITAL_COMMUNITY): Payer: Self-pay | Admitting: Orthopedic Surgery

## 2016-05-07 DIAGNOSIS — S76012A Strain of muscle, fascia and tendon of left hip, initial encounter: Secondary | ICD-10-CM | POA: Diagnosis not present

## 2016-05-07 MED ORDER — METHOCARBAMOL 500 MG PO TABS: 500.0000 mg | ORAL_TABLET | Freq: Four times a day (QID) | ORAL | 0 refills | Status: DC | PRN

## 2016-05-07 MED ORDER — ONDANSETRON HCL 4 MG PO TABS: 4.0000 mg | ORAL_TABLET | Freq: Four times a day (QID) | ORAL | 0 refills | Status: DC | PRN

## 2016-05-07 MED ORDER — DOCUSATE SODIUM 100 MG PO CAPS: 100.0000 mg | ORAL_CAPSULE | Freq: Two times a day (BID) | ORAL | 3 refills | Status: DC

## 2016-05-07 MED ORDER — SENNA 8.6 MG PO TABS: 2.0000 | ORAL_TABLET | Freq: Every day | ORAL | 3 refills | Status: DC

## 2016-05-07 MED ORDER — HYDROCODONE-ACETAMINOPHEN 5-325 MG PO TABS: 1.0000 | ORAL_TABLET | ORAL | 0 refills | Status: DC | PRN

## 2016-05-07 MED ORDER — SODIUM CHLORIDE 0.9 % IV BOLUS (SEPSIS): 250.0000 mL | Freq: Once | INTRAVENOUS | Status: DC

## 2016-05-07 NOTE — Progress Notes (Signed)
   05/07/16 1430  PT Visit Information  Last PT Received On 05/07/16  Assistance Needed +1  History of Present Illness Gluteus medius/abductor tendon repair, left hip  Precautions  Precaution Comments no active abduction of left hip  Required Braces or Orthoses Other Brace/Splint  Knee Immobilizer - Right On at all times  Other Brace/Splint left hip abduction brace  Restrictions  LLE Weight Bearing WBAT  Pain Assessment  Pain Score 3  Pain Location L hip  Pain Descriptors / Indicators Discomfort  Cognition  Arousal/Alertness Awake/alert  Bed Mobility  Bed Mobility Sit to Supine  Supine to sit Min assist  Transfers  Overall transfer level Needs assistance  Equipment used Rolling walker (2 wheeled)  Transfers Sit to/from Stand  Sit to Stand Supervision  Ambulation/Gait  Ambulation/Gait assistance Min guard  Ambulation Distance (Feet) 40 Feet  Assistive device Rolling walker (2 wheeled)  Gait Pattern/deviations Step-through pattern  General Gait Details cues for sequence  Stairs Yes  Stairs assistance Min assist  Stair Management No rails;Backwards;With walker  Number of Stairs 2  General stair comments mother present to assist., then spouse instructed in 2 steps with RW  PT - End of Session  Activity Tolerance Patient tolerated treatment well  Patient left in bed;with family/visitor present  Nurse Communication Mobility status  PT - Assessment/Plan  PT Frequency (ACUTE ONLY) Min 5X/week  Follow Up Recommendations No PT follow up  PT equipment None recommended by PT  PT Goal Progression  Progress towards PT goals Progressing toward goals  PT Time Calculation  PT Start Time (ACUTE ONLY) 1401  PT Stop Time (ACUTE ONLY) 1430  PT Time Calculation (min) (ACUTE ONLY) 29 min  PT General Charges  $$ ACUTE PT VISIT 1 Procedure  PT Treatments  $Gait Training 23-37 mins  [place] PT [phone]

## 2016-05-07 NOTE — Evaluation (Signed)
Physical Therapy Evaluation Patient Details Name: Kayelynn Abdou MRN: [id number] DOB: Nov 20, 1970 Today's Date: 05/07/2016   History of Present Illness  Gluteus medius/abductor tendon repair, left hip  Clinical Impression  The patient became N/V after ambulation, hypotensive at 88/49. Improved to 92/60. Pt admitted with above diagnosis. Pt currently with functional limitations due to the deficits listed below (see PT Problem List).  Pt will benefit from skilled PT to increase their independence and safety with mobility to allow discharge to the venue listed below.       Follow Up Recommendations No PT follow up    Equipment Recommendations  None recommended by PT    Recommendations for Other Services       Precautions / Restrictions Precautions Precautions: Fall Precaution Comments: no active abduction of left hip Required Braces or Orthoses: Other Brace/Splint Knee Immobilizer - Right: On at all times Other Brace/Splint: left hip abduction brace Restrictions Weight Bearing Restrictions: Yes LLE Weight Bearing: Weight bearing as tolerated      Mobility  Bed Mobility Overal bed mobility: Needs Assistance Bed Mobility: Supine to Sit     Supine to sit: Min assist     General bed mobility comments: the patient was able to slide across the bed to get to sitting. cues for no abduction  Transfers Overall transfer level: Needs assistance Equipment used: Rolling walker (2 wheeled) Transfers: Sit to/from Stand Sit to Stand: Min guard         General transfer comment: cues for hand  and left leg postion  Ambulation/Gait Ambulation/Gait assistance: Min assist Ambulation Distance (Feet): 60 Feet Assistive device: Rolling walker (2 wheeled) Gait Pattern/deviations: Step-to pattern;Step-through pattern     General Gait Details: cues for sequence. After sitting on BSC inside the bathroom, patient c/o dizziness and nausea, + emesis. Patient gradually  felt better and was able  to get to recliner. BP 88/49, sats 100%, HR 69  Stairs            Wheelchair Mobility    Modified Rankin (Stroke Patients Only)       Balance                                             Pertinent Vitals/Pain Pain Assessment: 0-10 Pain Score: 6  Pain Location: L hip Pain Descriptors / Indicators: Dull;Aching    Home Living Family/patient expects to be discharged to:: Private residence Living Arrangements: Spouse/significant other;Other relatives Available Help at Discharge: Family Type of Home: House Home Access: Stairs to enter Entrance Stairs-Rails: None Entrance Stairs-Number of Steps: 2 Home Layout: One level Home Equipment: [place] - 2 wheels;Bedside commode      Prior Function Level of Independence: Independent               Hand Dominance        Extremity/Trunk Assessment   Upper Extremity Assessment: Overall WFL for tasks assessed           Lower Extremity Assessment: LLE deficits/detail   LLE Deficits / Details: leg in brace  Cervical / Trunk Assessment: Normal  Communication   Communication: No difficulties  Cognition Arousal/Alertness: Awake/alert Behavior During Therapy: WFL for tasks assessed/performed Overall Cognitive Status: Within Functional Limits for tasks assessed                      General Comments  Exercises     Assessment/Plan    PT Assessment Patient needs continued PT services  PT Problem List Decreased activity tolerance;Decreased mobility;Cardiopulmonary status limiting activity;Pain          PT Treatment Interventions DME instruction;Gait training;Stair training;Functional mobility training;Therapeutic activities;Therapeutic exercise;Patient/family education    PT Goals (Current goals can be found in the Care Plan section)  Acute Rehab PT Goals Patient Stated Goal: to go home PT Goal Formulation: With patient Time For Goal Achievement: 05/08/16 Potential to Achieve  Goals: Good    Frequency Min 5X/week   Barriers to discharge        Co-evaluation               End of Session   Activity Tolerance: Treatment limited secondary to medical complications (Comment) Patient left: in chair;with call bell/phone within reach;with family/visitor present Nurse Communication: Mobility status (N/V and low BP)    Functional Assessment Tool Used: clinical judgement Functional Limitation: Mobility: Walking and moving around Mobility: Walking and Moving Around Current Status (C6237): At least 20 percent but less than 40 percent impaired, limited or restricted Mobility: Walking and Moving Around Goal Status [phone]): At least 1 percent but less than 20 percent impaired, limited or restricted    Time: 1025-1106 PT Time Calculation (min) (ACUTE ONLY): 41 min   Charges:   PT Evaluation $PT Eval Low Complexity: 1 Procedure PT Treatments $Gait Training: 8-22 mins $Self Care/Home Management: 8-22   PT G Codes:   PT G-Codes **NOT FOR INPATIENT CLASS** Functional Assessment Tool Used: clinical judgement Functional Limitation: Mobility: Walking and moving around Mobility: Walking and Moving Around Current Status (D1761): At least 20 percent but less than 40 percent impaired, limited or restricted Mobility: Walking and Moving Around Goal Status [phone] At least 1 percent but less than 20 percent impaired, limited or restricted    Claretha Cooper 05/07/2016, 12:31 PM Tresa Endo PT [phone]

## 2016-05-07 NOTE — Progress Notes (Signed)
   Subjective:  Patient reports pain as mild to moderate.  No c/o.  Objective:   VITALS:   Vitals:   05/06/16 2145 05/06/16 2237 05/07/16 0223 05/07/16 0640  BP: 120/69 121/71 104/69 105/63  Pulse: 68 69 84 79  Resp: 16 16 16 16   Temp: 97.9 F (36.6 C) 98.2 F (36.8 C) 98.3 F (36.8 C) 98.4 F (36.9 C)  TempSrc: Oral Oral Oral Oral  SpO2: 98% 99% 98% 98%  Weight:      Height:        ABD soft Sensation intact distally Intact pulses distally Dorsiflexion/Plantar flexion intact Incision: dressing C/D/I Compartment soft Abduction brace in place   No results found for: WBC, HGB, HCT, MCV, PLT BMET No results found for: NA, K, CL, CO2, GLUCOSE, BUN, CREATININE, CALCIUM, GFRNONAA, GFRAA   Assessment/Plan: 1 Day Post-Op   Principal Problem:   Tendinopathy of left gluteus medius  Abduction brace at all times WBAT with walker DVT ppx: resume ASA, plavix. Low risk surgery PO pain control PT D/C home today   Elie Goody 05/07/2016, 8:14 AM   Rod Can, MD Cell [phone]

## 2016-05-07 NOTE — Progress Notes (Signed)
1450  Patient up and standing for [employer] to adjust brace. Patient feels faint B/P 84/53  DR Swinteck notified, Orders received  D Rosine Abe

## 2016-05-07 NOTE — Care Management Note (Signed)
Case Management Note  Patient Details  Name: Claire Briggs MRN: [id number] Date of Birth: 1971/01/15  Subjective/Objective:  45 y/o f admitted w/L hip abductor tendor tear, s/p repair. From home.No CM needs.                  Action/Plan:d/c home.   Expected Discharge Date:                  Expected Discharge Plan:  Home/Self Care  In-House Referral:     Discharge planning Services  CM Consult  Post Acute Care Choice:    Choice offered to:     DME Arranged:    DME Agency:     HH Arranged:    [place] Agency:     Status of Service:  Completed, signed off  If discussed at [employer] of Stay Meetings, dates discussed:    Additional Comments:  Dessa Phi, RN 05/07/2016, 12:17 PM

## 2016-05-07 NOTE — Progress Notes (Signed)
Physical Therapy Treatment Patient Details Name: Claire Briggs MRN: [id number] DOB: 1971/06/19 Today's Date: 05/07/2016    History of Present Illness Gluteus medius/abductor tendon repair, left hip    PT Comments    [employer] notified for brace adjusted. Patient was standing for several minutes. C/O dizziness. BP 84/57. Assisted to recliner. Will check back and check BP.  Follow Up Recommendations  No PT follow up     Equipment Recommendations  None recommended by PT    Recommendations for Other Services       Precautions / Restrictions Precautions Precautions: Fall Precaution Comments: no active abduction of left hip Required Braces or Orthoses: Other Brace/Splint Knee Immobilizer - Right: On at all times Other Brace/Splint: left hip abduction brace Restrictions Weight Bearing Restrictions: Yes LLE Weight Bearing: Weight bearing as tolerated    Mobility  Bed Mobility Overal bed mobility: Needs Assistance Bed Mobility: Supine to Sit     Supine to sit: Min assist     General bed mobility comments: the patient was able to slide across the bed to get to sitting. cues for no abduction  Transfers Overall transfer level: Needs assistance Equipment used: Rolling walker (2 wheeled) Transfers: Sit to/from Stand Sit to Stand: Min guard         General transfer comment: Patient  standing with [employer] having brace adjusted. The patient c/o feeling dizzy assisted to recliner. BP 84/57. RN  aware. Patient to rest until feels better.  Ambulation/Gait Ambulation/Gait assistance: Min assist Ambulation Distance (Feet): 60 Feet Assistive device: Rolling walker (2 wheeled) Gait Pattern/deviations: Step-to pattern;Step-through pattern     General Gait Details: cues for sequence. After sitting on BSC inside the bathroom, patient c/o dizziness and nausea, + emesis. Patient gradually  felt better and was able to get to recliner. BP 88/49, sats 100%, HR 69   Stairs             Wheelchair Mobility    Modified Rankin (Stroke Patients Only)       Balance                                    Cognition Arousal/Alertness: Awake/alert Behavior During Therapy: WFL for tasks assessed/performed Overall Cognitive Status: Within Functional Limits for tasks assessed                      Exercises      General Comments        Pertinent Vitals/Pain Pain Assessment: 0-10 Pain Score: 5  Pain Location: L  hip-brace pressure Pain Descriptors / Indicators: Tightness    Home Living Family/patient expects to be discharged to:: Private residence Living Arrangements: Spouse/significant other;Other relatives Available Help at Discharge: Family Type of Home: House Home Access: Stairs to enter Entrance Stairs-Rails: None Home Layout: One level Home Equipment: Environmental consultant - 2 wheels;Bedside commode      Prior Function Level of Independence: Independent          PT Goals (current goals can now be found in the care plan section) Acute Rehab PT Goals Patient Stated Goal: to go home PT Goal Formulation: With patient Time For Goal Achievement: 05/08/16 Potential to Achieve Goals: Good Progress towards PT goals: Progressing toward goals    Frequency    Min 5X/week      PT Plan Current plan remains appropriate    Co-evaluation  End of Session   Activity Tolerance: Treatment limited secondary to medical complications (Comment) Patient left: in chair;with call bell/phone within reach;with family/visitor present     Time: 6568-1275 PT Time Calculation (min) (ACUTE ONLY): 14 min  Charges:  $Gait Training: 8-22 mins $Self Care/Home Management: 8-22                    G Codes:  Functional Assessment Tool Used: clinical judgement Functional Limitation: Mobility: Walking and moving around Mobility: Walking and Moving Around Current Status [phone]): At least 20 percent but less than 40 percent impaired, limited or  restricted Mobility: Walking and Moving Around Goal Status [phone]): At least 1 percent but less than 20 percent impaired, limited or restricted   Claretha Cooper 05/07/2016, 3:52 PM Tresa Endo PT [phone]

## 2016-05-07 NOTE — Discharge Summary (Signed)
Physician Discharge Summary  Patient ID: Claire Briggs MRN: [id number] DOB/AGE: 04/05/1971 45 y.o.  Admit date: 05/06/2016 Discharge date: 05/07/2016  Admission Diagnoses:  Tendinopathy of left gluteus medius  Discharge Diagnoses:  Principal Problem:   Tendinopathy of left gluteus medius   Past Medical History:  Diagnosis Date  . Anginal pain ([place])   . ASCVD (arteriosclerotic cardiovascular disease)   . Coronary artery disease   . Dyslipidemia   . Hypothyroidism   . Menometrorrhagia   . Myocardial infarction     Surgeries: Procedure(s): LEFT HIP ABDUCTOR REPAIR on 05/06/2016   Consultants (if any):   Discharged Condition: Improved  Hospital Course: Helen Winterhalter is an 45 y.o. female who was admitted 05/06/2016 with a diagnosis of Tendinopathy of left gluteus medius and went to the operating room on 05/06/2016 and underwent the above named procedures.    She was given perioperative antibiotics:  Anti-infectives    Start     Dose/Rate Route Frequency Ordered Stop   05/07/16 0600  ceFAZolin (ANCEF) IVPB 2g/100 mL premix     2 g 200 mL/hr over 30 Minutes Intravenous On call to O.R. 05/06/16 1329 05/06/16 1657   05/06/16 2300  ceFAZolin (ANCEF) IVPB 2g/100 mL premix     2 g 200 mL/hr over 30 Minutes Intravenous Every 6 hours 05/06/16 1947 05/07/16 1659    .  She was given sequential compression devices, early ambulation, and ASA, plavix for DVT prophylaxis. Patient received abduction brace.  She benefited maximally from the hospital stay and there were no complications.    Recent vital signs:  Vitals:   05/07/16 0223 05/07/16 0640  BP: 104/69 105/63  Pulse: 84 79  Resp: 16 16  Temp: 98.3 F (36.8 C) 98.4 F (36.9 C)    Recent laboratory studies:  No results found for: HGB No results found for: WBC, PLT No results found for: INR No results found for: NA, K, CL, CO2, BUN, CREATININE, GLUCOSE  Discharge Medications:     Medication List    STOP taking these  medications   ibuprofen 200 MG tablet Commonly known as:  ADVIL,MOTRIN     TAKE these medications   aspirin EC 81 MG tablet Take 81 mg by mouth daily.   atorvastatin 40 MG tablet Commonly known as:  LIPITOR Take 40 mg by mouth every evening.   clopidogrel 75 MG tablet Commonly known as:  PLAVIX Take 75 mg by mouth daily.   docusate sodium 100 MG capsule Commonly known as:  COLACE Take 1 capsule (100 mg total) by mouth 2 (two) times daily.   HYDROcodone-acetaminophen 5-325 MG tablet Commonly known as:  NORCO/VICODIN Take 1-2 tablets by mouth every 4 (four) hours as needed (breakthrough pain).   levothyroxine 50 MCG tablet Commonly known as:  SYNTHROID, LEVOTHROID Take 50 mcg by mouth daily before breakfast.   lisinopril 5 MG tablet Commonly known as:  PRINIVIL,ZESTRIL Take 5 mg by mouth daily.   methocarbamol 500 MG tablet Commonly known as:  ROBAXIN Take 1 tablet (500 mg total) by mouth every 6 (six) hours as needed for muscle spasms.   naproxen sodium 220 MG tablet Commonly known as:  ANAPROX Take 220 mg by mouth 2 (two) times daily as needed (for pain.).   nitroGLYCERIN 0.4 MG SL tablet Commonly known as:  NITROSTAT Place 0.4 mg under the tongue every 5 (five) minutes x 3 doses as needed. For chest pain   ondansetron 4 MG tablet Commonly known as:  ZOFRAN Take 1 tablet (  4 mg total) by mouth every 6 (six) hours as needed for nausea.   senna 8.6 MG Tabs tablet Commonly known as:  SENOKOT Take 2 tablets (17.2 mg total) by mouth at bedtime.   Turmeric 500 MG Caps Take 500 mg by mouth every evening.       Diagnostic Studies: No results found.  Disposition: Final discharge disposition not confirmed  Discharge Instructions    Call MD / Call 911    Complete by:  As directed    If you experience chest pain or shortness of breath, CALL 911 and be transported to the hospital emergency room.  If you develope a fever above 101 F, pus (white drainage) or  increased drainage or redness at the wound, or calf pain, call your surgeon's office.   Constipation Prevention    Complete by:  As directed    Drink plenty of fluids.  Prune juice may be helpful.  You may use a stool softener, such as Colace (over the counter) 100 mg twice a day.  Use MiraLax (over the counter) for constipation as needed.   Diet - low sodium heart healthy    Complete by:  As directed    Discharge instructions    Complete by:  As directed    Wear abduction brace at all times NO active abduction for 6 weeks   Driving restrictions    Complete by:  As directed    No driving for 6 weeks   Increase activity slowly as tolerated    Complete by:  As directed    Lifting restrictions    Complete by:  As directed    No lifting for 6 weeks         Signed: Elie Goody 05/07/2016, 8:17 AM

## 2016-05-07 NOTE — Op Note (Signed)
[name], Briggs NO.:  [id number]  MEDICAL RECORD NO.:  [id number]  LOCATION:  5409                         FACILITY:  [facility]  PHYSICIAN:  Rod Can, MD     DATE OF BIRTH:  March 24, 1971  DATE OF PROCEDURE:  05/06/2016 DATE OF DISCHARGE:                              OPERATIVE REPORT   SURGEON:  Rod Can, MD.  ASSISTANT:  Victorino December, MD.  PREOPERATIVE DIAGNOSIS:  Abductor tendon tear, left hip.  POSTOPERATIVE DIAGNOSIS:  Abductor tendon tear, left hip.  PROCEDURE PERFORMED:  Gluteus medius/abductor tendon repair, left hip.  IMPLANTS:  [employer] JuggerKnot 4.5-mm knotless anchors x2.  ANESTHESIA:  General.  ESTIMATED BLOOD LOSS:  50 mL.  DRAINS:  None.  SPECIMENS:  None.  DISPOSITION:  Stable to PACU.  COMPLICATIONS:  None.  ANTIBIOTICS:  2 g Ancef.  INDICATIONS:  The patient is a 45 year old female with MRI documented full-thickness gluteus medius tendon tear to the left hip that is symptomatic.  She failed conservative treatment.  I discussed with her the risks, benefits, and alternatives to surgical fixation and she elected to proceed.  DESCRIPTION OF PROCEDURE IN DETAIL:  The patient was correctly identified in the holding area using 2 identifiers.  Surgical site was marked by myself.  She was taken to the operating room.  General anesthesia was induced on the bed.  She was then transferred to the operating room table and turned to the right lateral decubitus position. All bony prominences were well padded.  Axillary roll was placed.  She was held with a hip positioner.  The left hip was prepped and draped in normal sterile surgical fashion.  Time-out was called verifying side and site of surgery.  She did receive IV antibiotics within 60 minutes of beginning the procedure.  I made a longitudinal incision centered over the tip of the trochanter. The IT band was divided in line with fibers.  The IT band was  retracted with a Charnley retractor.  I identified the tip of her trochanter. From the lateral view that we had, the abductor tendon looked intact.  I could feel a defect on the anterior aspect of the trochanter.  I made a transverse incision into the abductor tendon based on preoperative MRI which showed that she had an anterior based gluteus tear.  Once I made the incision, it was obvious that the abductor tendon anteriorly was very thin.  She had a retracted tear involving the anterior 50% of the gluteus medius tendon.  The anterior half of the trochanter was bald.  I used a 15 blade to fresh the edges of her abductor tendon.  I used a curette and rongeur to obtain a bleeding bony base on the trochanter.  I used a 2-mm FiberTape stitch x2 to place a horizontal mattress suture. The sutures were crossed and then I provisionally reduced the abductor tendon.  I selected my points for the 2 suture anchors. This was done a couple of centimeters proximal to the vastus tubercle.  The anchor sites were punched and then I started first with the anterior anchor. The sutures were placed through the islet of the anchor.  Provisional  tension was held.  I advanced the anchor.  The appropriate tension was placed and then the anchor was completely advanced and the handle was removed.  The same procedure was performed for the posterior anchor, thus giving a single row repair with 2 anchors and 4 limbs.  The abductor tear was anatomically reduced.  There were no dog ears.  The repair looked excellent.  I then used a rongeur to excisionally debride her trochanteric bursal tissue as she was having some trochanteric bursitis pain.  I then copiously irrigated the wound with saline.  The IT band was closed with #1 Vicryl and 0 V-Loc.  The deep fatty tissue was closed with 2-0 Vicryl.  Deep dermal layer was closed with 2-0 interrupted Monocryl and a 3-0 running Monocryl subcuticular stitch. Dermabond was  applied.  Once the glue was dried, an Aquacel dressing was placed.  The patient was then flipped supine, extubated, an abduction pillow was placed, and she was transferred to the PACU in stable condition.  Sponge, needle, and instrument counts were correct at the end of the case x2.  There were no known complications.  I discussed the operative events and findings with the patient's family. We will admit her overnight for observation.  We will order an abduction brace to be delivered tomorrow.  We will resume her aspirin and Plavix. She may weight bear as tolerated.  I will see her back in the office in 2 weeks.          ______________________________ Rod Can, MD     BS/MEDQ  D:  05/06/2016  T:  05/07/2016  Job:  [id number]

## 2016-05-07 NOTE — Progress Notes (Signed)
Physical Therapy Treatment Patient Details Name: Kalanie Fewell MRN: [id number] DOB: Apr 20, 1971 Today's Date: 05/07/2016    History of Present Illness Gluteus medius/abductor tendon repair, left hip    PT Comments    Patient feeling better. RN reports to give a blous of fluids. Continue PT if not DC'd later.  Follow Up Recommendations  No PT follow up     Equipment Recommendations  None recommended by PT    Recommendations for Other Services       Precautions / Restrictions Precautions Precautions: Fall Precaution Comments: no active abduction of left hip Required Braces or Orthoses: Other Brace/Splint Knee Immobilizer - Right: On at all times Other Brace/Splint: left hip abduction brace Restrictions Weight Bearing Restrictions: Yes LLE Weight Bearing: Weight bearing as tolerated    Mobility  Bed Mobility Overal bed mobility: Needs Assistance Bed Mobility: Sit to Supine     Supine to sit: Min assist Sit to supine: Min assist   General bed mobility comments: assist the left leg  Transfers Overall transfer level: Needs assistance Equipment used: Rolling walker (2 wheeled) Transfers: Sit to/from Stand Sit to Stand: Supervision          Ambulation/Gait Ambulation/Gait assistance: Supervision Ambulation Distance (Feet): 20 Feet (x2) Assistive device: Rolling walker (2 wheeled) Gait Pattern/deviations: Step-to pattern;Step-through pattern     General Gait Details: BP after ambulation 101/62   Stairs  Wheelchair Mobility    Modified Rankin (Stroke Patients Only)       Balance                                    Cognition Arousal/Alertness: Awake/alert Behavior During Therapy: WFL for tasks assessed/performed Overall Cognitive Status: Within Functional Limits for tasks assessed                      Exercises      General Comments        Pertinent Vitals/Pain Pain Assessment: 0-10 Pain Score: 4  Pain Location: L  hip/brace Pain Descriptors / Indicators: Tightness    Home Living Family/patient expects to be discharged to:: Private residence Living Arrangements: Spouse/significant other;Other relatives Available Help at Discharge: Family Type of Home: House Home Access: Stairs to enter Entrance Stairs-Rails: None Home Layout: One level Home Equipment: Environmental consultant - 2 wheels;Bedside commode      Prior Function Level of Independence: Independent          PT Goals (current goals can now be found in the care plan section) Acute Rehab PT Goals Patient Stated Goal: to go home PT Goal Formulation: With patient Time For Goal Achievement: 05/08/16 Potential to Achieve Goals: Good Progress towards PT goals: Progressing toward goals    Frequency    Min 5X/week      PT Plan Current plan remains appropriate    Co-evaluation             End of Session   Activity Tolerance: Patient tolerated treatment well Patient left: in bed;with call bell/phone within reach;with family/visitor present     Time: 0175-1025 PT Time Calculation (min) (ACUTE ONLY): 11 min  Charges:  $Gait Training: 8-22 mins $Self Care/Home Management: 8-22                    G Codes:  Functional Assessment Tool Used: clinical judgement Functional Limitation: Mobility: Walking and moving around Mobility: Walking and Moving Around Current Status (  G8978): At least 20 percent but less than 40 percent impaired, limited or restricted Mobility: Walking and Moving Around Goal Status [phone]): At least 1 percent but less than 20 percent impaired, limited or restricted   Claretha Cooper 05/07/2016, 4:06 PM

## 2016-12-07 ENCOUNTER — Other Ambulatory Visit: Payer: Self-pay | Admitting: Family Medicine

## 2016-12-07 DIAGNOSIS — Z1231 Encounter for screening mammogram for malignant neoplasm of breast: Secondary | ICD-10-CM

## 2017-01-03 ENCOUNTER — Ambulatory Visit
Admission: RE | Admit: 2017-01-03 | Discharge: 2017-01-03 | Disposition: A | Discharge status: Home / Self Care | Payer: BC Managed Care – PPO | Source: Ambulatory Visit | Attending: Family Medicine | Admitting: Family Medicine

## 2017-01-03 DIAGNOSIS — Z1231 Encounter for screening mammogram for malignant neoplasm of breast: Secondary | ICD-10-CM

## 2018-01-18 ENCOUNTER — Other Ambulatory Visit: Payer: Self-pay | Admitting: Family Medicine

## 2018-01-18 DIAGNOSIS — Z1231 Encounter for screening mammogram for malignant neoplasm of breast: Secondary | ICD-10-CM

## 2018-01-20 ENCOUNTER — Ambulatory Visit
Admission: RE | Admit: 2018-01-20 | Discharge: 2018-01-20 | Disposition: A | Discharge status: Home / Self Care | Payer: BC Managed Care – PPO | Source: Ambulatory Visit | Attending: Family Medicine | Admitting: Family Medicine

## 2018-01-20 DIAGNOSIS — Z1231 Encounter for screening mammogram for malignant neoplasm of breast: Secondary | ICD-10-CM

## 2019-01-16 ENCOUNTER — Other Ambulatory Visit: Payer: Self-pay | Admitting: Family

## 2019-01-16 DIAGNOSIS — Z1231 Encounter for screening mammogram for malignant neoplasm of breast: Secondary | ICD-10-CM

## 2019-02-27 ENCOUNTER — Ambulatory Visit
Admission: RE | Admit: 2019-02-27 | Discharge: 2019-02-27 | Disposition: A | Discharge status: Home / Self Care | Payer: BC Managed Care – PPO | Source: Ambulatory Visit | Attending: *Deleted | Admitting: *Deleted

## 2019-02-27 ENCOUNTER — Other Ambulatory Visit: Payer: Self-pay

## 2019-02-27 DIAGNOSIS — Z1231 Encounter for screening mammogram for malignant neoplasm of breast: Secondary | ICD-10-CM

## 2019-02-28 ENCOUNTER — Other Ambulatory Visit: Payer: Self-pay | Admitting: Family

## 2019-02-28 DIAGNOSIS — R928 Other abnormal and inconclusive findings on diagnostic imaging of breast: Secondary | ICD-10-CM

## 2019-03-02 ENCOUNTER — Ambulatory Visit
Admission: RE | Admit: 2019-03-02 | Discharge: 2019-03-02 | Disposition: A | Discharge status: Home / Self Care | Payer: BC Managed Care – PPO | Source: Ambulatory Visit | Attending: *Deleted | Admitting: *Deleted

## 2019-03-02 ENCOUNTER — Other Ambulatory Visit: Payer: Self-pay | Admitting: Family

## 2019-03-02 ENCOUNTER — Other Ambulatory Visit: Payer: Self-pay

## 2019-03-02 DIAGNOSIS — R921 Mammographic calcification found on diagnostic imaging of breast: Secondary | ICD-10-CM

## 2019-03-02 DIAGNOSIS — R928 Other abnormal and inconclusive findings on diagnostic imaging of breast: Secondary | ICD-10-CM

## 2019-03-09 ENCOUNTER — Ambulatory Visit
Admission: RE | Admit: 2019-03-09 | Discharge: 2019-03-09 | Disposition: A | Discharge status: Home / Self Care | Payer: BC Managed Care – PPO | Source: Ambulatory Visit | Attending: *Deleted | Admitting: *Deleted

## 2019-03-09 ENCOUNTER — Other Ambulatory Visit: Payer: Self-pay

## 2019-03-09 DIAGNOSIS — R921 Mammographic calcification found on diagnostic imaging of breast: Secondary | ICD-10-CM

## 2019-03-26 ENCOUNTER — Other Ambulatory Visit: Payer: Self-pay | Admitting: General Surgery

## 2019-03-26 DIAGNOSIS — D0502 Lobular carcinoma in situ of left breast: Secondary | ICD-10-CM

## 2019-03-30 ENCOUNTER — Other Ambulatory Visit: Payer: Self-pay | Admitting: General Surgery

## 2019-03-30 DIAGNOSIS — D0502 Lobular carcinoma in situ of left breast: Secondary | ICD-10-CM

## 2019-04-26 NOTE — Progress Notes (Signed)
[place], [place] [address] [place] [address] Phone: [phone] Fax: [phone]      Your procedure is scheduled on  05-02-19 from 1230-1400  Report to [facility] "A" at 1030 A.M., and check in at the Admitting office.  Call this number if you have problems the morning of surgery:  [phone]  Call [phone] if you have any questions prior to your surgery date Monday-Friday 8am-4pm    Remember:  Do not eat or drink after midnight the night before your surgery  You may drink clear liquids until  0930AM the morning of your surgery.   Clear liquids allowed are: Water, Non-Citrus Juices (without pulp), Carbonated Beverages, Clear Tea, Black Coffee Only, and Gatorade   Take these medicines the morning of surgery with A SIP OF WATER : levothyroxine (SYNTHROID, LEVOTHROID  Follow your surgeon's instructions on when to stop Aspirin.  If no instructions were given by your surgeon then you will need to call the office to get those instructions.   7 days prior to surgery STOP taking any Aspirin (unless otherwise instructed by your surgeon), Aleve, Naproxen, Ibuprofen, Motrin, Advil, Goody's, BC's, all herbal medications, fish oil, and all vitamins.    The Morning of Surgery  Do not wear jewelry, make-up or nail polish.  Do not wear lotions, powders, or perfumes/colognes, or deodorant  Do not shave 48 hours prior to surgery.   Do not bring valuables to the hospital.  [facility] is not responsible for any belongings or valuables.  If you are a smoker, DO NOT Smoke 24 hours prior to surgery IF you wear a CPAP at night please bring your mask, tubing, and machine the morning of surgery   Remember that you must have someone to transport you home after your surgery, and remain with you for 24 hours if you are discharged the same day.   Contacts, glasses, hearing aids, dentures or bridgework may not be worn into surgery.     Leave your suitcase in the car.  After surgery it may be brought to your room.  For patients admitted to the hospital, discharge time will be determined by your treatment team.  Patients discharged the day of surgery will not be allowed to drive home.    Special instructions:   [place]- Preparing For Surgery  Before surgery, you can play an important role. Because skin is not sterile, your skin needs to be as free of germs as possible. You can reduce the number of germs on your skin by washing with CHG (chlorahexidine gluconate) Soap before surgery.  CHG is an antiseptic cleaner which kills germs and bonds with the skin to continue killing germs even after washing.    Oral Hygiene is also important to reduce your risk of infection.  Remember - BRUSH YOUR TEETH THE MORNING OF SURGERY WITH YOUR REGULAR TOOTHPASTE  Please do not use if you have an allergy to CHG or antibacterial soaps. If your skin becomes reddened/irritated stop using the CHG.  Do not shave (including legs and underarms) for at least 48 hours prior to first CHG shower. It is OK to shave your face.  Please follow these instructions carefully.   1. Shower the NIGHT BEFORE SURGERY and the MORNING OF SURGERY with CHG Soap.   2. If you chose to wash your hair, wash your hair first as usual with your normal shampoo.  3. After you shampoo,  rinse your hair and body thoroughly to remove the shampoo.  4. Use CHG as you would any other liquid soap. You can apply CHG directly to the skin and wash gently with a scrungie or a clean washcloth.   5. Apply the CHG Soap to your body ONLY FROM THE NECK DOWN.  Do not use on open wounds or open sores. Avoid contact with your eyes, ears, mouth and genitals (private parts). Wash Face and genitals (private parts)  with your normal soap.   6. Wash thoroughly, paying special attention to the area where your surgery will be performed.  7. Thoroughly rinse your body with warm water from  the neck down.  8. DO NOT shower/wash with your normal soap after using and rinsing off the CHG Soap.  9. Pat yourself dry with a CLEAN TOWEL.  10. Wear CLEAN PAJAMAS to bed the night before surgery, wear comfortable clothes the morning of surgery  11. Place CLEAN SHEETS on your bed the night of your first shower and DO NOT SLEEP WITH PETS.    Day of Surgery:  Do not apply any deodorants/lotions. Please shower the morning of surgery with the CHG soap  Please wear clean clothes to the hospital/surgery center.   Remember to brush your teeth WITH YOUR REGULAR TOOTHPASTE.   Please read over the  fact sheets that you were given.

## 2019-04-27 ENCOUNTER — Other Ambulatory Visit: Payer: Self-pay

## 2019-04-27 ENCOUNTER — Encounter (HOSPITAL_COMMUNITY)
Admission: RE | Admit: 2019-04-27 | Discharge: 2019-04-27 | Disposition: A | Discharge status: Home / Self Care | Payer: BC Managed Care – PPO | Source: Ambulatory Visit | Attending: General Surgery | Admitting: General Surgery

## 2019-04-27 ENCOUNTER — Encounter (HOSPITAL_COMMUNITY): Payer: Self-pay

## 2019-04-27 DIAGNOSIS — D0502 Lobular carcinoma in situ of left breast: Secondary | ICD-10-CM | POA: Insufficient documentation

## 2019-04-27 DIAGNOSIS — Z01812 Encounter for preprocedural laboratory examination: Secondary | ICD-10-CM | POA: Insufficient documentation

## 2019-04-27 DIAGNOSIS — I498 Other specified cardiac arrhythmias: Secondary | ICD-10-CM | POA: Diagnosis not present

## 2019-04-27 DIAGNOSIS — Z01810 Encounter for preprocedural cardiovascular examination: Secondary | ICD-10-CM | POA: Diagnosis not present

## 2019-04-27 HISTORY — DX: Other specified postprocedural states: Z98.890

## 2019-04-27 HISTORY — DX: Nausea with vomiting, unspecified: R11.2

## 2019-04-27 LAB — BASIC METABOLIC PANEL
Anion gap: 7 (ref 5–15)
BUN: 14 mg/dL (ref 6–20)
CO2: 26 mmol/L (ref 22–32)
Calcium: 8.8 mg/dL — ABNORMAL LOW (ref 8.9–10.3)
Chloride: 104 mmol/L (ref 98–111)
Creatinine, Ser: 0.78 mg/dL (ref 0.44–1.00)
GFR calc Af Amer: >60 mL/min (ref >60)
GFR calc non Af Amer: >60 mL/min (ref >60)
Glucose, Bld: 92 mg/dL (ref 70–99)
Potassium: 4 mmol/L (ref 3.5–5.1)
Sodium: 137 mmol/L (ref 135–145)

## 2019-04-27 LAB — CBC
HCT: 41 % (ref 36.0–46.0)
Hemoglobin: 13.5 g/dL (ref 12.0–15.0)
MCH: 31.4 pg (ref 26.0–34.0)
MCHC: 32.9 g/dL (ref 30.0–36.0)
MCV: 95.3 fL (ref 80.0–100.0)
Platelets: 297 10*3/uL (ref 150–400)
RBC: 4.3 MIL/uL (ref 3.87–5.11)
RDW: 12 % (ref 11.5–15.5)
WBC: 6.1 10*3/uL (ref 4.0–10.5)
nRBC: 0 % (ref 0.0–0.2)

## 2019-04-27 LAB — POCT PREGNANCY, URINE: Preg Test, Ur: NEGATIVE

## 2019-04-27 NOTE — Progress Notes (Signed)
  Coronavirus Screening COVID test scheduled for Sat. Have you experienced the following symptoms:  Cough yes/no: No Fever (>100.72F)  yes/no: No Runny nose yes/no: No Sore throat yes/no: No Difficulty breathing/shortness of breath  yes/no: No Loss of smell or taste-No Have you or a family member traveled in the last 14 days and where? yes/no: No  PCP - Isidoro Donning  Cardiologist - Dr Bishop Limbo  Gynaecologist- Dr Modesta Messing  Chest x-ray - NA  EKG - Today  Stress Test - 05-06-15  ECHO - 2016  Cardiac Cath -05/15   AICD-denies PM-denies LOOP-denies  Sleep Study - denies CPAP - NA  LABS-CBC,BMP,Upreg  ASA-LD-04/24/19  ERAS-clear liquids only  HA1C-No Fasting Blood Sugar -  Checks Blood Sugar _____ times a day  Anesthesia-Y H/o MI 2015 .  Scheduled for seed placement on Tues 09/29 Pt denies having palpitations,chest pain, sob, or fever at this time. All instructions explained to the pt, with a verbal understanding of the material. Pt agrees to go over the instructions while at home for a better understanding. Pt also instructed to self quarantine after being tested for COVID-19. The opportunity to ask questions was provided.

## 2019-04-28 ENCOUNTER — Other Ambulatory Visit (HOSPITAL_COMMUNITY)
Admission: RE | Admit: 2019-04-28 | Discharge: 2019-04-28 | Disposition: A | Discharge status: Home / Self Care | Payer: BC Managed Care – PPO | Source: Ambulatory Visit | Attending: General Surgery | Admitting: General Surgery

## 2019-04-28 ENCOUNTER — Other Ambulatory Visit (HOSPITAL_COMMUNITY): Payer: BC Managed Care – PPO

## 2019-04-28 DIAGNOSIS — Z01812 Encounter for preprocedural laboratory examination: Secondary | ICD-10-CM | POA: Insufficient documentation

## 2019-04-28 DIAGNOSIS — Z20828 Contact with and (suspected) exposure to other viral communicable diseases: Secondary | ICD-10-CM | POA: Insufficient documentation

## 2019-04-29 LAB — NOVEL CORONAVIRUS, NAA (HOSP ORDER, SEND-OUT TO REF LAB; TAT 18-24 HRS): SARS-CoV-2, NAA: NOT DETECTED

## 2019-04-30 ENCOUNTER — Encounter (HOSPITAL_COMMUNITY): Payer: Self-pay

## 2019-04-30 NOTE — Anesthesia Preprocedure Evaluation (Addendum)
Anesthesia Evaluation  Patient identified by MRN, date of birth, ID band Patient awake    Reviewed: Allergy & Precautions, NPO status , Patient's Chart, lab work & pertinent test results  History of Anesthesia Complications (+) PONV and history of anesthetic complications  Airway Mallampati: II  TM Distance: >3 FB Neck ROM: Full    Dental no notable dental hx. (+) Teeth Intact, Dental Advisory Given   Pulmonary neg pulmonary ROS,    Pulmonary exam normal breath sounds clear to auscultation       Cardiovascular hypertension, Pt. 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 normal exercise stress echo in 05/2015.    Neuro/Psych negative neurological ROS  negative psych ROS   GI/Hepatic negative GI ROS, Neg liver ROS,   Endo/Other  negative endocrine ROSHypothyroidism   Renal/GU negative Renal ROS  negative genitourinary   Musculoskeletal negative musculoskeletal ROS (+)   Abdominal   Peds  Hematology negative hematology ROS (+)   Anesthesia Other Findings   Reproductive/Obstetrics negative OB ROS                                                             Anesthesia Evaluation  Patient identified by MRN, date of birth, ID band Patient awake    Reviewed: Allergy & Precautions, NPO status , Patient's Chart, lab work & pertinent test results  Airway Mallampati: I  TM Distance: >3 FB Neck ROM: Full    Dental  (+) Teeth Intact, Dental Advisory Given   Pulmonary neg pulmonary ROS,    breath sounds clear to auscultation       Cardiovascular hypertension, Pt. on medications + CAD, + Past MI and + Cardiac Stents   Rhythm:Regular Rate:Normal     Neuro/Psych negative  neurological ROS  negative psych ROS   GI/Hepatic negative GI ROS, Neg liver ROS,   Endo/Other  Hypothyroidism   Renal/GU negative Renal ROS  negative genitourinary   Musculoskeletal negative musculoskeletal ROS (+)   Abdominal   Peds negative pediatric ROS (+)  Hematology negative hematology ROS (+)   Anesthesia Other Findings   Reproductive/Obstetrics negative OB ROS                           Anesthesia Physical Anesthesia Plan  ASA: II  Anesthesia Plan: General   Post-op Pain Management:    Induction: Intravenous  Airway Management Planned: Oral ETT  Additional Equipment:   Intra-op Plan:   Post-operative Plan: Extubation in OR  Informed Consent: I have reviewed the patients History and Physical, chart, labs and discussed the procedure including the risks, benefits and alternatives for the proposed anesthesia with the patient or authorized representative who has indicated his/her understanding and acceptance.   Dental advisory given  Plan Discussed with: CRNA  Anesthesia Plan Comments:        Anesthesia Quick Evaluation  Anesthesia Physical Anesthesia Plan  ASA: III  Anesthesia Plan: General   Post-op Pain Management:    Induction: Intravenous  PONV Risk Score and Plan: 4 or greater and Ondansetron, Dexamethasone,  Scopolamine patch - Pre-op, Treatment may vary due to age or medical condition and Midazolam  Airway Management Planned: LMA  Additional Equipment: None  Intra-op Plan:   Post-operative Plan: Extubation in OR  Informed Consent: I have reviewed the patients History and Physical, chart, labs and discussed the procedure including the risks, benefits and alternatives for the proposed anesthesia with the patient or authorized representative who has indicated his/her understanding and acceptance.     Dental advisory given  Plan Discussed with: CRNA  Anesthesia Plan Comments: ( )      Anesthesia  Quick Evaluation

## 2019-04-30 NOTE — Progress Notes (Addendum)
Anesthesia Chart Review:  Case: [id number] Date/Time: 05/02/19 1215   Procedure: LEFT BREAST LUMPECTOMY WITH RADIOACTIVE SEED LOCALIZATION (Left Breast)   Anesthesia type: General   Pre-op diagnosis: LEFT BREAST LCIS   Location: [place] [facility] [place] [facility]   Surgeon: Stark Klein, MD    RSL 04/30/09 at 1:15 PM.   DISCUSSION: Patient is a 48 year old female scheduled for the above procedure. 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   History includes never smoker, post-operative N/V, CAD (anterior MI, s/p atherectomy DES LAD and DES D2 12/07/13), dyslipidemia, hypothyroidism.   Dr. Barry Dienes did seek preoperative cardiology input. On 03/29/19, cardiologist Dr. Quillian Quince composed a letter indicating, ".Marland KitchenMarland KitchenIn my opinion, as of the last time I saw this patient in fall 2019, she was stable and and [sic] in good condition to proceed with surgery. There is no need for stress testing or any other procedures prior to proceeding with surgery. ..." Aspirin can be held if needed. According to cardiology notes in Care Everywhere, EF the day following her stents showed an EF 45% with "Her recovery echo showed EF 61% with only hypokinesis limited to the apex." She had a normal exercise stress echo in 05/2015. Cardiology notes indicate that she is not on a beta blocker due to side effect of severe fatigue.  She denied chest pain, palpitations, SOB, fever at PAT RN visit.  Urine pregnancy test negative on 04/27/19 and COVID-19 negative on 04/28/19.  If no acute changes then I anticipate that she can proceed as planned.   VS: BP 128/72   Pulse 72   Temp 36.5 C (Oral)   Resp 16   Ht 5' 6"  (1.676 m)   Wt 85.7 kg   SpO2 100%   BMI 30.49 kg/m    PROVIDERS: Madison Hickman, FNP is PCP Bishop Limbo, DO is cardiologist ([place]). Last visit 04/24/18.    LABS: Labs reviewed: Acceptable for surgery. (all labs ordered are listed, but only abnormal results are displayed)  Labs  Reviewed  BASIC METABOLIC PANEL - Abnormal; Notable for the following components:      Result Value   Calcium 8.8 (*)    All other components within normal limits  CBC  POCT PREGNANCY, URINE     EKG: 04/27/19: Normal sinus rhythm with sinus arrhythmia Low voltage QRS Borderline ECG No previous tracing Confirmed by Lauree Chandler [phone]) on 04/27/2019 11:11:17 AM   CV:  Stress Echo 05/22/15  [facility], scanned Media tab, Correspondence, 05/06/16): Conclusions 1. Good function exercise capacity - 10 METS 2. Normal resting biventricular size/function, EF 55-60% with no residual segmental abnormalities. 3. No clinical or echocardiographic ischemia at target heart rate.  NEGATIVE exercise treadmill stress test.   Cardiac cath 12/07/13 [facility]): Summary: Anterolateral STEMI.  Acute thrombosis of the mid LAD.  Heavy thrombus burden.  Otherwise normal coronaries.  LVEDP 25 mmHg.  LV gram was not performed to reduce contrast exposure. PCI Summary: 1. Successful mechanical (AngioJet) thrombectomy of LAD and diagonal 2. 2.  Successful stenting of the LAD with a 3.5/33 mm Xience drug-eluting stent. 3.  Successful stenting of the second diagonal with a 2.5/28 mm Xience drug-eluting stent.   Past Medical History:  Diagnosis Date  . Anginal pain ([place]) 11/2013  . ASCVD (arteriosclerotic cardiovascular disease)   . Coronary artery disease   . Dyslipidemia   . Hypothyroidism   . Menometrorrhagia   . Myocardial infarction ([place])   .  PONV (postoperative nausea and vomiting)     Past Surgical History:  Procedure Laterality Date  . CORONARY ANGIOPLASTY  2015   atherectomy/DES LAD, DES D2 11/2013 [facility])  . EXCISION/RELEASE BURSA HIP Left 05/06/2016   Procedure: LEFT HIP ABDUCTOR REPAIR;  Surgeon: Rod Can, MD;  Location: [facility];  Service: Orthopedics;  Laterality: Left;  . HYSTEROSCOPY W/ ENDOMETRIAL ABLATION    . KNEE ARTHROSCOPY Left   .  TONSILLECTOMY      MEDICATIONS: . aspirin EC 81 MG tablet  . atorvastatin (LIPITOR) 40 MG tablet  . Cholecalciferol (VITAMIN D3) 50 MCG (2000 UT) TABS  . levothyroxine (SYNTHROID, LEVOTHROID) 50 MCG tablet  . lisinopril (PRINIVIL,ZESTRIL) 5 MG tablet  . venlafaxine XR (EFFEXOR-XR) 75 MG 24 hr capsule   No current facility-administered medications for this encounter.   Last ASA 04/24/19.   Myra Gianotti, PA-C Surgical Short Stay/Anesthesiology [facility] Phone [phone] [facility] Phone [phone] 05/01/2019 12:49 PM

## 2019-05-01 ENCOUNTER — Ambulatory Visit
Admission: RE | Admit: 2019-05-01 | Discharge: 2019-05-01 | Disposition: A | Discharge status: Home / Self Care | Payer: BC Managed Care – PPO | Source: Ambulatory Visit | Attending: General Surgery | Admitting: General Surgery

## 2019-05-01 ENCOUNTER — Other Ambulatory Visit: Payer: Self-pay

## 2019-05-01 DIAGNOSIS — D0502 Lobular carcinoma in situ of left breast: Secondary | ICD-10-CM

## 2019-05-02 ENCOUNTER — Ambulatory Visit (HOSPITAL_COMMUNITY)
Admission: RE | Admit: 2019-05-02 | Discharge: 2019-05-02 | Disposition: A | Discharge status: Home / Self Care | Payer: BC Managed Care – PPO | Attending: General Surgery | Admitting: General Surgery

## 2019-05-02 ENCOUNTER — Ambulatory Visit (HOSPITAL_COMMUNITY): Payer: BC Managed Care – PPO | Admitting: Physician Assistant

## 2019-05-02 ENCOUNTER — Ambulatory Visit
Admission: RE | Admit: 2019-05-02 | Discharge: 2019-05-02 | Disposition: A | Discharge status: Home / Self Care | Payer: BC Managed Care – PPO | Source: Ambulatory Visit | Attending: General Surgery | Admitting: General Surgery

## 2019-05-02 ENCOUNTER — Encounter (HOSPITAL_COMMUNITY)
Admission: RE | Disposition: A | Discharge status: Home / Self Care | Payer: Self-pay | Source: Home / Self Care | Attending: General Surgery

## 2019-05-02 ENCOUNTER — Encounter (HOSPITAL_COMMUNITY): Payer: Self-pay

## 2019-05-02 ENCOUNTER — Ambulatory Visit (HOSPITAL_COMMUNITY): Payer: BC Managed Care – PPO | Admitting: Vascular Surgery

## 2019-05-02 ENCOUNTER — Other Ambulatory Visit: Payer: Self-pay

## 2019-05-02 DIAGNOSIS — M1990 Unspecified osteoarthritis, unspecified site: Secondary | ICD-10-CM | POA: Diagnosis not present

## 2019-05-02 DIAGNOSIS — D0502 Lobular carcinoma in situ of left breast: Secondary | ICD-10-CM

## 2019-05-02 DIAGNOSIS — D241 Benign neoplasm of right breast: Secondary | ICD-10-CM | POA: Insufficient documentation

## 2019-05-02 DIAGNOSIS — Z803 Family history of malignant neoplasm of breast: Secondary | ICD-10-CM | POA: Insufficient documentation

## 2019-05-02 DIAGNOSIS — Z955 Presence of coronary angioplasty implant and graft: Secondary | ICD-10-CM | POA: Insufficient documentation

## 2019-05-02 DIAGNOSIS — Z79899 Other long term (current) drug therapy: Secondary | ICD-10-CM | POA: Insufficient documentation

## 2019-05-02 DIAGNOSIS — E039 Hypothyroidism, unspecified: Secondary | ICD-10-CM | POA: Insufficient documentation

## 2019-05-02 DIAGNOSIS — I252 Old myocardial infarction: Secondary | ICD-10-CM | POA: Diagnosis not present

## 2019-05-02 DIAGNOSIS — I2510 Atherosclerotic heart disease of native coronary artery without angina pectoris: Secondary | ICD-10-CM | POA: Insufficient documentation

## 2019-05-02 DIAGNOSIS — E785 Hyperlipidemia, unspecified: Secondary | ICD-10-CM | POA: Diagnosis not present

## 2019-05-02 DIAGNOSIS — Z79890 Hormone replacement therapy (postmenopausal): Secondary | ICD-10-CM | POA: Insufficient documentation

## 2019-05-02 DIAGNOSIS — I10 Essential (primary) hypertension: Secondary | ICD-10-CM | POA: Diagnosis not present

## 2019-05-02 HISTORY — PX: BREAST EXCISIONAL BIOPSY: SUR124

## 2019-05-02 HISTORY — PX: BREAST LUMPECTOMY WITH RADIOACTIVE SEED LOCALIZATION: SHX6424

## 2019-05-02 SURGERY — BREAST LUMPECTOMY WITH RADIOACTIVE SEED LOCALIZATION
Anesthesia: General | Site: Breast | Laterality: Left

## 2019-05-02 MED ORDER — MIDAZOLAM HCL 2 MG/2ML IJ SOLN
INTRAMUSCULAR | Status: DC | PRN
  Administered 2019-05-02: 2 mg via INTRAVENOUS

## 2019-05-02 MED ORDER — CEFAZOLIN SODIUM-DEXTROSE 2-4 GM/100ML-% IV SOLN
2.0000 g | INTRAVENOUS | Status: AC
  Administered 2019-05-02: 2 g via INTRAVENOUS

## 2019-05-02 MED ORDER — LIDOCAINE HCL 1 % IJ SOLN
INTRAMUSCULAR | Status: AC
  Filled 2019-05-02: qty 20

## 2019-05-02 MED ORDER — PHENYLEPHRINE HCL (PRESSORS) 10 MG/ML IV SOLN
INTRAVENOUS | Status: DC | PRN
  Administered 2019-05-02: 80 ug via INTRAVENOUS

## 2019-05-02 MED ORDER — PHENYLEPHRINE 40 MCG/ML (10ML) SYRINGE FOR IV PUSH (FOR BLOOD PRESSURE SUPPORT)
PREFILLED_SYRINGE | INTRAVENOUS | Status: AC
  Filled 2019-05-02: qty 20

## 2019-05-02 MED ORDER — GABAPENTIN 300 MG PO CAPS
ORAL_CAPSULE | ORAL | Status: AC
  Administered 2019-05-02: 300 mg via ORAL
  Filled 2019-05-02: qty 1

## 2019-05-02 MED ORDER — FENTANYL CITRATE (PF) 250 MCG/5ML IJ SOLN
INTRAMUSCULAR | Status: DC | PRN
  Administered 2019-05-02 (×5): 50 ug via INTRAVENOUS

## 2019-05-02 MED ORDER — OXYCODONE HCL 5 MG PO TABS: 5.0000 mg | ORAL_TABLET | Freq: Four times a day (QID) | ORAL | 0 refills | Status: DC | PRN

## 2019-05-02 MED ORDER — LIDOCAINE 2% (20 MG/ML) 5 ML SYRINGE
INTRAMUSCULAR | Status: DC | PRN
  Administered 2019-05-02: 60 mg via INTRAVENOUS

## 2019-05-02 MED ORDER — PROPOFOL 10 MG/ML IV BOLUS
INTRAVENOUS | Status: DC | PRN
  Administered 2019-05-02: 200 mg via INTRAVENOUS
  Administered 2019-05-02 (×2): 20 mg via INTRAVENOUS

## 2019-05-02 MED ORDER — SCOPOLAMINE 1 MG/3DAYS TD PT72
1.0000 | MEDICATED_PATCH | TRANSDERMAL | Status: DC
  Administered 2019-05-02: 13:00:00 1 via TRANSDERMAL

## 2019-05-02 MED ORDER — ACETAMINOPHEN 500 MG PO TABS
ORAL_TABLET | ORAL | Status: AC
  Administered 2019-05-02: 1000 mg via ORAL
  Filled 2019-05-02: qty 2

## 2019-05-02 MED ORDER — FENTANYL CITRATE (PF) 250 MCG/5ML IJ SOLN
INTRAMUSCULAR | Status: AC
  Filled 2019-05-02: qty 5

## 2019-05-02 MED ORDER — PROPOFOL 10 MG/ML IV BOLUS
INTRAVENOUS | Status: AC
  Filled 2019-05-02: qty 20

## 2019-05-02 MED ORDER — METHYLENE BLUE 0.5 % INJ SOLN
INTRAVENOUS | Status: AC
  Filled 2019-05-02: qty 10

## 2019-05-02 MED ORDER — CHLORHEXIDINE GLUCONATE CLOTH 2 % EX PADS: 6.0000 | MEDICATED_PAD | Freq: Once | CUTANEOUS | Status: DC

## 2019-05-02 MED ORDER — 0.9 % SODIUM CHLORIDE (POUR BTL) OPTIME
TOPICAL | Status: DC | PRN
  Administered 2019-05-02: 14:00:00 1000 mL

## 2019-05-02 MED ORDER — MIDAZOLAM HCL 2 MG/2ML IJ SOLN
INTRAMUSCULAR | Status: AC
  Filled 2019-05-02: qty 2

## 2019-05-02 MED ORDER — FENTANYL CITRATE (PF) 250 MCG/5ML IJ SOLN: INTRAMUSCULAR | Status: DC | PRN

## 2019-05-02 MED ORDER — ONDANSETRON HCL 4 MG/2ML IJ SOLN
INTRAMUSCULAR | Status: AC
  Filled 2019-05-02: qty 6

## 2019-05-02 MED ORDER — CEFAZOLIN SODIUM-DEXTROSE 2-4 GM/100ML-% IV SOLN
INTRAVENOUS | Status: AC
  Filled 2019-05-02: qty 100

## 2019-05-02 MED ORDER — LIDOCAINE HCL 1 % IJ SOLN
INTRAMUSCULAR | Status: DC | PRN
  Administered 2019-05-02: 10 mL via INTRAMUSCULAR
  Administered 2019-05-02: 30 mL via INTRAMUSCULAR

## 2019-05-02 MED ORDER — BUPIVACAINE-EPINEPHRINE (PF) 0.25% -1:200000 IJ SOLN
INTRAMUSCULAR | Status: AC
  Filled 2019-05-02: qty 20

## 2019-05-02 MED ORDER — ACETAMINOPHEN 500 MG PO TABS
1000.0000 mg | ORAL_TABLET | ORAL | Status: AC
  Administered 2019-05-02: 11:00:00 1000 mg via ORAL

## 2019-05-02 MED ORDER — GABAPENTIN 300 MG PO CAPS
300.0000 mg | ORAL_CAPSULE | ORAL | Status: AC
  Administered 2019-05-02: 11:00:00 300 mg via ORAL

## 2019-05-02 MED ORDER — LACTATED RINGERS IV SOLN
INTRAVENOUS | Status: DC
  Administered 2019-05-02: 11:00:00 via INTRAVENOUS

## 2019-05-02 MED ORDER — DEXAMETHASONE SODIUM PHOSPHATE 10 MG/ML IJ SOLN
INTRAMUSCULAR | Status: DC | PRN
  Administered 2019-05-02: 5 mg via INTRAVENOUS

## 2019-05-02 MED ORDER — PROPOFOL 1000 MG/100ML IV EMUL
INTRAVENOUS | Status: AC
  Filled 2019-05-02: qty 100

## 2019-05-02 MED ORDER — ONDANSETRON HCL 4 MG/2ML IJ SOLN
INTRAMUSCULAR | Status: DC | PRN
  Administered 2019-05-02: 4 mg via INTRAVENOUS

## 2019-05-02 MED ORDER — FENTANYL CITRATE (PF) 100 MCG/2ML IJ SOLN
INTRAMUSCULAR | Status: AC
  Filled 2019-05-02: qty 2

## 2019-05-02 MED ORDER — MIDAZOLAM HCL 2 MG/2ML IJ SOLN: INTRAMUSCULAR | Status: DC | PRN

## 2019-05-02 MED ORDER — LIDOCAINE 2% (20 MG/ML) 5 ML SYRINGE
INTRAMUSCULAR | Status: AC
  Filled 2019-05-02: qty 15

## 2019-05-02 SURGICAL SUPPLY — 44 items
BINDER BREAST LRG (GAUZE/BANDAGES/DRESSINGS) IMPLANT
BINDER BREAST XLRG (GAUZE/BANDAGES/DRESSINGS) ×3 IMPLANT
BLADE SURG 10 STRL SS (BLADE) ×3 IMPLANT
CANISTER SUCT 3000ML PPV (MISCELLANEOUS) ×3 IMPLANT
CHLORAPREP W/TINT 26 (MISCELLANEOUS) ×3 IMPLANT
CLIP VESOCCLUDE LG 6/CT (CLIP) ×3 IMPLANT
CLIP VESOCCLUDE MED 6/CT (CLIP) ×3 IMPLANT
CLIP VESOCCLUDE SM WIDE 6/CT (CLIP) ×3 IMPLANT
CLOSURE WOUND 1/2 X4 (GAUZE/BANDAGES/DRESSINGS) ×1
COVER PROBE W GEL 5X96 (DRAPES) ×3 IMPLANT
COVER SURGICAL LIGHT HANDLE (MISCELLANEOUS) ×3 IMPLANT
COVER WAND RF STERILE (DRAPES) ×3 IMPLANT
DERMABOND ADVANCED (GAUZE/BANDAGES/DRESSINGS) ×2
DERMABOND ADVANCED .7 DNX12 (GAUZE/BANDAGES/DRESSINGS) ×1 IMPLANT
DEVICE DUBIN SPECIMEN MAMMOGRA (MISCELLANEOUS) ×3 IMPLANT
DRAPE CHEST BREAST 15X10 FENES (DRAPES) ×3 IMPLANT
DRSG PAD ABDOMINAL 8X10 ST (GAUZE/BANDAGES/DRESSINGS) ×3 IMPLANT
ELECT COATED BLADE 2.86 ST (ELECTRODE) ×3 IMPLANT
ELECT REM PT RETURN 9FT ADLT (ELECTROSURGICAL) ×3
ELECTRODE REM PT RTRN 9FT ADLT (ELECTROSURGICAL) ×1 IMPLANT
GAUZE SPONGE 4X4 12PLY STRL LF (GAUZE/BANDAGES/DRESSINGS) ×3 IMPLANT
GLOVE BIO SURGEON STRL SZ 6 (GLOVE) ×3 IMPLANT
GLOVE INDICATOR 6.5 STRL GRN (GLOVE) ×3 IMPLANT
GOWN STRL REUS W/ TWL LRG LVL3 (GOWN DISPOSABLE) ×1 IMPLANT
GOWN STRL REUS W/TWL 2XL LVL3 (GOWN DISPOSABLE) ×3 IMPLANT
GOWN STRL REUS W/TWL LRG LVL3 (GOWN DISPOSABLE) ×2
KIT BASIN OR (CUSTOM PROCEDURE TRAY) ×3 IMPLANT
KIT MARKER MARGIN INK (KITS) ×3 IMPLANT
LIGHT WAVEGUIDE WIDE FLAT (MISCELLANEOUS) ×3 IMPLANT
NEEDLE HYPO 25GX1X1/2 BEV (NEEDLE) ×3 IMPLANT
NS IRRIG 1000ML POUR BTL (IV SOLUTION) ×3 IMPLANT
PACK GENERAL/GYN (CUSTOM PROCEDURE TRAY) ×3 IMPLANT
STRIP CLOSURE SKIN 1/2X4 (GAUZE/BANDAGES/DRESSINGS) ×2 IMPLANT
SUT MNCRL AB 4-0 PS2 18 (SUTURE) ×3 IMPLANT
SUT SILK 2 0 SH (SUTURE) IMPLANT
SUT VIC AB 2-0 SH 27 (SUTURE) ×2
SUT VIC AB 2-0 SH 27XBRD (SUTURE) ×1 IMPLANT
SUT VIC AB 3-0 SH 18 (SUTURE) ×3 IMPLANT
SUT VIC AB 3-0 SH 27 (SUTURE) ×2
SUT VIC AB 3-0 SH 27X BRD (SUTURE) ×1 IMPLANT
SUT VIC AB 3-0 SH 8-18 (SUTURE) ×3 IMPLANT
SYR CONTROL 10ML LL (SYRINGE) ×3 IMPLANT
TOWEL GREEN STERILE (TOWEL DISPOSABLE) ×3 IMPLANT
TOWEL GREEN STERILE FF (TOWEL DISPOSABLE) ×3 IMPLANT

## 2019-05-02 NOTE — Transfer of Care (Signed)
Immediate Anesthesia Transfer of Care Note  Patient: Claire Briggs  Procedure(s) Performed: LEFT BREAST LUMPECTOMY WITH RADIOACTIVE SEED LOCALIZATION (Left Breast)  Patient Location: PACU  Anesthesia Type:General  Level of Consciousness: awake, alert  and patient cooperative  Airway & Oxygen Therapy: Patient Spontanous Breathing  Post-op Assessment: Report given to RN and Post -op Vital signs reviewed and stable  Post vital signs: Reviewed and stable  Last Vitals:  Vitals Value Taken Time  BP 125/81 05/02/19 1426  Temp    Pulse 97 05/02/19 1428  Resp 17 05/02/19 1428  SpO2 95 % 05/02/19 1428  Vitals shown include unvalidated device data.  Last Pain:  Vitals:   05/02/19 1103  TempSrc:   PainSc: 0-No pain         Complications: No apparent anesthesia complications

## 2019-05-02 NOTE — Discharge Instructions (Addendum)
[facility] °Office Phone Number [phone] ° °BREAST BIOPSY/ PARTIAL MASTECTOMY: POST OP INSTRUCTIONS ° °Always review your discharge instruction sheet given to you by the facility where your surgery was performed. ° °IF YOU HAVE DISABILITY OR FAMILY LEAVE FORMS, YOU MUST BRING THEM TO THE OFFICE FOR PROCESSING.  DO NOT GIVE THEM TO YOUR DOCTOR. ° °1. A prescription for pain medication may be given to you upon discharge.  Take your pain medication as prescribed, if needed.  If narcotic pain medicine is not needed, then you may take acetaminophen (Tylenol) or ibuprofen (Advil) as needed. °2. Take your usually prescribed medications unless otherwise directed °3. If you need a refill on your pain medication, please contact your pharmacy.  They will contact our office to request authorization.  Prescriptions will not be filled after 5pm or on week-ends. °4. You should eat very light the first 24 hours after surgery, such as soup, crackers, pudding, etc.  Resume your normal diet the day after surgery. °5. Most patients will experience some swelling and bruising in the breast.  Ice packs and a good support bra will help.  Swelling and bruising can take several days to resolve.  °6. It is common to experience some constipation if taking pain medication after surgery.  Increasing fluid intake and taking a stool softener will usually help or prevent this problem from occurring.  A mild laxative (Milk of Magnesia or Miralax) should be taken according to package directions if there are no bowel movements after 48 hours. °7. Unless discharge instructions indicate otherwise, you may remove your bandages 48 hours after surgery, and you may shower at that time.  You may have steri-strips (small skin tapes) in place directly over the incision.  These strips should be left on the skin for 7-10 days.   Any sutures or staples will be removed at the office during your follow-up visit. °8. ACTIVITIES:  You may resume  regular daily activities (gradually increasing) beginning the next day.  Wearing a good support bra or sports bra (or the breast binder) minimizes pain and swelling.  You may have sexual intercourse when it is comfortable. °a. You may drive when you no longer are taking prescription pain medication, you can comfortably wear a seatbelt, and you can safely maneuver your car and apply brakes. °b. RETURN TO WORK:  __________1 week_______________ °9. You should see your doctor in the office for a follow-up appointment approximately two weeks after your surgery.  Your doctor’s nurse will typically make your follow-up appointment when she calls you with your pathology report.  Expect your pathology report 2-3 business days after your surgery.  You may call to check if you do not hear from us after three days. ° ° °WHEN TO CALL YOUR DOCTOR: °1. Fever over 101.0 °2. Nausea and/or vomiting. °3. Extreme swelling or bruising. °4. Continued bleeding from incision. °5. Increased pain, redness, or drainage from the incision. ° °The clinic staff is available to answer your questions during regular business hours.  Please don’t hesitate to call and ask to speak to one of the nurses for clinical concerns.  If you have a medical emergency, go to the nearest emergency room or call 911.  A surgeon from [facility] is always on call at the hospital. ° °For further questions, please visit [email or web address]  ° °

## 2019-05-02 NOTE — Anesthesia Postprocedure Evaluation (Signed)
Anesthesia Post Note  Patient: Claire Briggs  Procedure(s) Performed: LEFT BREAST LUMPECTOMY WITH RADIOACTIVE SEED LOCALIZATION (Left Breast)     Patient location during evaluation: PACU Anesthesia Type: General Level of consciousness: awake Pain management: pain level controlled Cardiovascular status: stable Postop Assessment: no apparent nausea or vomiting Anesthetic complications: no    Last Vitals:  Vitals:   05/02/19 1511 05/02/19 1526  BP: 134/88 130/86  Pulse: 91 89  Resp: 18 14  Temp:  36.7 C  SpO2: 98% 100%    Last Pain:  Vitals:   05/02/19 1526  TempSrc:   PainSc: 0-No pain                 Kriston Pasquarello

## 2019-05-02 NOTE — Interval H&P Note (Signed)
History and Physical Interval Note:  05/02/2019 12:50 PM  Claire Briggs  has presented today for surgery, with the diagnosis of LEFT BREAST LCIS.  The various methods of treatment have been discussed with the patient and family. After consideration of risks, benefits and other options for treatment, the patient has consented to  Procedure(s): LEFT BREAST LUMPECTOMY WITH RADIOACTIVE SEED LOCALIZATION (Left) as a surgical intervention.  The patient's history has been reviewed, patient examined, no change in status, stable for surgery.  I have reviewed the patient's chart and labs.  Questions were answered to the patient's satisfaction.     Stark Klein

## 2019-05-02 NOTE — Anesthesia Postprocedure Evaluation (Signed)
Anesthesia Post Note  Patient: Claire Briggs  Procedure(s) Performed: LEFT BREAST LUMPECTOMY WITH RADIOACTIVE SEED LOCALIZATION (Left Breast)     Patient location during evaluation: PACU Anesthesia Type: General Level of consciousness: awake Pain management: pain level controlled Respiratory status: spontaneous breathing Cardiovascular status: stable Postop Assessment: no apparent nausea or vomiting Anesthetic complications: no    Last Vitals:  Vitals:   05/02/19 1511 05/02/19 1526  BP: 134/88 130/86  Pulse: 91 89  Resp: 18 14  Temp:  36.7 C  SpO2: 98% 100%    Last Pain:  Vitals:   05/02/19 1526  TempSrc:   PainSc: 0-No pain                 [name]

## 2019-05-02 NOTE — Anesthesia Procedure Notes (Signed)
Procedure Name: Intubation Date/Time: 05/02/2019 1:04 PM Performed by: Janace Litten, CRNA Pre-anesthesia Checklist: Patient identified, Emergency Drugs available, Suction available and Patient being monitored Patient Re-evaluated:Patient Re-evaluated prior to induction Oxygen Delivery Method: Circle System Utilized Preoxygenation: Pre-oxygenation with 100% oxygen Induction Type: IV induction LMA: LMA inserted LMA Size: 4.0 Tube type: Oral Number of attempts: 1 Airway Equipment and Method: Stylet and Oral airway Placement Confirmation: positive ETCO2 and breath sounds checked- equal and bilateral Tube secured with: Tape Dental Injury: Teeth and Oropharynx as per pre-operative assessment

## 2019-05-02 NOTE — H&P (Signed)
Claire Briggs Location: [facility] Patient #: [id number] DOB: 12-13-1970 Married / Language: English / Race: White Female   History of Present Illness The patient is a 48 year old female who presents for a follow-up for Breast cancer. Pt is a lovely 48 yo F referred for consultation by Dr. 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 She had screening detected left breast calcifications, so dx mammo was performed. This showed 2x0.3x0.3 cm of concerning calcifications. She underwent core needle biopsy and was seen to have LCIS. She has no personal history of cancer. Her paternal grandmother had breast cancer in her 51s or 73s and then later had metastatic cancer of which she passed. They were not sure if the metastatic cancer was a recurrence of the breast cancer or a different cancer. She and her family have no knowledge of other cancers in the family line. She took OCPs for a bit but stopped them at least 5 years ago. She had an MI 4 years ago and knows she had been off them for a while. She is off platelet inhibitors (other than 81 mg ASA) at this point and is symptom free. Menarche was 30 and she is G2P2 with first child in early 34s.   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    left breast dx mammogram 03/02/2019 CLINICAL DATA: The patient returns after screening study for evaluation of LEFT breast calcifications.  EXAM: DIGITAL DIAGNOSTIC LEFT MAMMOGRAM WITH CAD  COMPARISON: Previous exam(s).  ACR Breast Density Category c: The breast tissue is heterogeneously dense, which may obscure small masses.  FINDINGS: Additional 2-D and 3-D images are performed. These views demonstrate a group of fine pleomorphic microcalcifications in the UPPER-OUTER QUADRANT of the LEFT breast measuring 2.0 x 0.3 x 0.3 centimeters.  Mammographic images were processed with CAD.  IMPRESSION: Indeterminate LEFT breast calcifications for which tissue  diagnosis is recommended.  RECOMMENDATION: Stereotactic guided core biopsy of LEFT breast calcifications.  I have discussed the findings and recommendations with the patient. Results were also provided in writing at the conclusion of the visit. If applicable, a reminder letter will be sent to the patient regarding the next appointment.  BI-RADS CATEGORY 4: Suspicious.   pathology 03/09/2019 Breast, left, needle core biopsy, UOQ - LOBULAR NEOPLASIA (LOBULAR CARCINOMA IN SITU). SEE NOTE - BACKGROUND BREAST PARENCHYMA WITH FIBROCYSTIC CHANGE AND DYSTROPHIC CALCIFICATIONS   Past Surgical History Breast Biopsy  Left. Hip Surgery  Left. Knee Surgery  Left. Oral Surgery  Tonsillectomy   Diagnostic Studies History Mammogram  within last year Pap Smear  1-5 years ago  Allergies  No Known Allergies  [03/26/2019]: No Known Drug Allergies  [03/26/2019]: Allergies Reconciled   Medication History  Atorvastatin Calcium (40MG Tablet, Oral) Active. Lisinopril (5MG Tablet, Oral) Active. Venlafaxine HCl ER (37.5MG Capsule ER 24HR, Oral) Active. Venlafaxine HCl ER (75MG Capsule ER 24HR, Oral) Active. Levothyroxine Sodium (50MCG Tablet, Oral) Active. Omega 3 (340MG Capsule DR, Oral) Active. Medications Reconciled  Social History  Caffeine use  Carbonated beverages, Coffee. No alcohol use  No drug use  Tobacco use  Never smoker.  Family History Alcohol Abuse  Brother. Arthritis  Brother, Mother. Breast Cancer  Family Members In General. Cerebrovascular Accident  Family Members In General. Depression  Brother, Mother. Diabetes Mellitus  Family Members In General. Heart Disease  Family Members In General. Hypertension  Brother, Mother. Migraine Headache  Daughter, Mother. Respiratory Condition  Family Members In General. Thyroid problems  Mother.  Pregnancy / Birth History  Age at menarche  60 years. Age of menopause  42-50 Contraceptive  History  Oral contraceptives. Gravida  2 Length (months) of breastfeeding  7-12 Maternal age  9-25 Para  2  Other Problems Arthritis  General anesthesia - complications  Myocardial infarction  Thyroid Disease     Review of Systems General Not Present- Appetite Loss, Chills, Fatigue, Fever, Night Sweats, Weight Gain and Weight Loss. Skin Not Present- Change in Wart/Mole, Dryness, Hives, Jaundice, New Lesions, Non-Healing Wounds, Rash and Ulcer. HEENT Present- Wears glasses/contact lenses. Not Present- Earache, Hearing Loss, Hoarseness, Nose Bleed, Oral Ulcers, Ringing in the Ears, Seasonal Allergies, Sinus Pain, Sore Throat, Visual Disturbances and Yellow Eyes. Respiratory Not Present- Bloody sputum, Chronic Cough, Difficulty Breathing, Snoring and Wheezing. Cardiovascular Not Present- Chest Pain, Difficulty Breathing Lying Down, Leg Cramps, Palpitations, Rapid Heart Rate, Shortness of Breath and Swelling of Extremities. Female Genitourinary Not Present- Frequency, Nocturia, Painful Urination, Pelvic Pain and Urgency. Musculoskeletal Present- Joint Pain. Not Present- Back Pain, Joint Stiffness, Muscle Pain, Muscle Weakness and Swelling of Extremities. Endocrine Present- Hot flashes. Not Present- Cold Intolerance, Excessive Hunger, Hair Changes, Heat Intolerance and New Diabetes. Hematology Not Present- Blood Thinners, Easy Bruising, Excessive bleeding, Gland problems, HIV and Persistent Infections.  Vitals Weight: 187.6 lb Height: 66in Body Surface Area: 1.95 m Body Mass Index: 30.28 kg/m  Temp.: 97.19F (Temporal)  Pulse: 97 (Regular)  BP: 148/90(Sitting, Left Arm, Standard)    Physical Exam  General Mental Status-Alert. General Appearance-Consistent with stated age. Hydration-Well hydrated. Voice-Normal.  Head and Neck Head-normocephalic, atraumatic with no lesions or palpable masses. Trachea-midline. Thyroid Gland Characteristics -  normal size and consistency.  Eye Eyeball - Bilateral-Extraocular movements intact. Sclera/Conjunctiva - Bilateral-No scleral icterus.  Chest and Lung Exam Chest and lung exam reveals -quiet, even and easy respiratory effort with no use of accessory muscles and on auscultation, normal breath sounds, no adventitious sounds and normal vocal resonance. Inspection Chest Wall - Normal. Back - normal.  Breast Note: breasts are reasonably symmetric. mild ptosis. relatively dense bilaterally. non tender. no nipple retraction or skin dimpling. no nipple discharge. No bruising seen. No palpable masses. No LAD either breast. no hematoma visible or palpable at biopsy site.   Cardiovascular Cardiovascular examination reveals -normal heart sounds, regular rate and rhythm with no murmurs and normal pedal pulses bilaterally.  Abdomen Inspection Inspection of the abdomen reveals - No Hernias. Palpation/Percussion Palpation and Percussion of the abdomen reveal - Soft, Non Tender, No Rebound tenderness, No Rigidity (guarding) and No hepatosplenomegaly. Auscultation Auscultation of the abdomen reveals - Bowel sounds normal.  Neurologic Neurologic evaluation reveals -alert and oriented x 3 with no impairment of recent or remote memory. Mental Status-Normal.  Musculoskeletal Global Assessment -Note: no gross deformities.  Normal Exam - Left-Upper Extremity Strength Normal and Lower Extremity Strength Normal. Normal Exam - Right-Upper Extremity Strength Normal and Lower Extremity Strength Normal.  Lymphatic Head & Neck  General Head & Neck Lymphatics: Bilateral - Description - Normal. Axillary  General Axillary Region: Bilateral - Description - Normal. Tenderness - Non Tender. Femoral & Inguinal  Generalized Femoral & Inguinal Lymphatics: Bilateral - Description - No Generalized lymphadenopathy.    Assessment & Plan   LOBULAR CARCINOMA IN SITU (LCIS) OF LEFT BREAST  (D05.02) Impression: Pt will need excision of this. This is technically a risk factor for development of invasive cancer. I will plan seen localized lumpectomy and refer to oncology for consideration of tamoxifen. If she indeed  has invasive cancer, she will need genetic testing. She is going to discuss with her husband whether she wants to come to [facility] for oncology or see oncology in [place].  The surgical procedure was described to the patient. I discussed the incision type and location and that we would need radiology involved on with a wire or seed marker and/or sentinel node.  The risks and benefits of the procedure were described to the patient and she wishes to proceed.  We discussed the risks bleeding, infection, damage to other structures, need for further procedures/surgeries. We discussed the risk of seroma. The patient was advised if the area in the breast in cancer, we may need to go back to surgery for additional tissue to obtain negative margins or for a lymph node biopsy. The patient was advised that these are the most common complications, but that others can occur as well. They were advised against taking aspirin or other anti-inflammatory agents/blood thinners the week before surgery.  Current Plans You are being scheduled for surgery- Our schedulers will call you.  You should hear from our office's scheduling department within 5 working days about the location, date, and time of surgery. We try to make accommodations for patient's preferences in scheduling surgery, but sometimes the OR schedule or the surgeon's schedule prevents [place] from making those accommodations.  If you have not heard from our office [phone] in 5 working days, call the office and ask for your surgeon's nurse.  If you have other questions about your diagnosis, plan, or surgery, call the office and ask for your surgeon's nurse.  Pt Education - flb breast cancer surgery: discussed with patient and  provided information.   Signed by Stark Klein, MD

## 2019-05-02 NOTE — Op Note (Signed)
Left Breast Radioactive seed localized lumpectomy  Indications: This patient presents with history of left breast LCIS, UOQ  Pre-operative Diagnosis: LCIS left breast  Post-operative Diagnosis: same  Surgeon: Stark Klein   Anesthesia: General endotracheal anesthesia  ASA Class: 2  Procedure Details  The patient was seen in the Holding Room. The risks, benefits, complications, treatment options, and expected outcomes were discussed with the patient. The possibilities of bleeding, infection, the need for additional procedures, failure to diagnose a condition, and creating a complication requiring transfusion or operation were discussed with the patient. The patient concurred with the proposed plan, giving informed consent.  The site of surgery properly noted/marked. The patient was taken to Operating Room # 4, identified, and the procedure verified as Left Breast seed localized lumpectomy. A Time Out was held and the above information confirmed.  The left breast and chest were prepped and draped in standard fashion. The lumpectomy was performed by creating an axillary incision near the previously placed radioactive seed.  Dissection was carried down to around the point of maximum signal intensity. The cautery was used to perform the dissection.  Hemostasis was achieved with cautery. The edges of the cavity were marked with large clips, with one each medial, lateral, inferior and superior, and two clips posteriorly.   The specimen was inked with the margin marker paint kit.    Specimen radiography confirmed inclusion of the mammographic lesion, the clip, and the seed.  The background signal in the breast was zero.  The wound was irrigated and closed with 3-0 vicryl in layers and 4-0 monocryl subcuticular suture.      Sterile dressings were applied. At the end of the operation, all sponge, instrument, and needle counts were correct.  Findings: grossly clear surgical margins and no adenopathy.   Posterior margin is pectoralis  Estimated Blood Loss:  min         Specimens: left breast lumpectomy with seed         Complications:  None; patient tolerated the procedure well.         Disposition: PACU - hemodynamically stable.         Condition: stable

## 2019-05-02 NOTE — Progress Notes (Signed)
Spoke to Dr. Doroteo Glassman.  Patient had negative pregnancy test 04/27/19.  Dr. Doroteo Glassman said do not repeat pregnancy test.

## 2019-05-03 ENCOUNTER — Encounter (HOSPITAL_COMMUNITY): Payer: Self-pay | Admitting: General Surgery

## 2019-05-04 LAB — SURGICAL PATHOLOGY

## 2019-05-07 NOTE — Progress Notes (Signed)
Please let patient know that no invasive cancer is seen and margins are negative.

## 2019-05-30 ENCOUNTER — Telehealth: Payer: Self-pay | Admitting: Hematology and Oncology

## 2019-05-30 NOTE — Telephone Encounter (Signed)
A new patient appt has been scheduled for Claire Briggs to see Dr Lindi Adie on 11/10 at 345pm for breast cancer. Claire Briggs has been made aware to arrive 15 minutes early. Address and insurance has been verified.

## 2019-06-11 NOTE — Progress Notes (Signed)
[facility] CONSULT NOTE  Patient Care Team: Madison Hickman, FNP as PCP - General (Family Medicine)  CHIEF COMPLAINTS/PURPOSE OF CONSULTATION:  Newly diagnosed ALH/LCIS  HISTORY OF PRESENTING ILLNESS:  Claire Briggs 48 y.o. female is here because of recent diagnosis of atypical lobular hyperplasia of the left breast. The cancer was detected on a routine screening mammogram on 02/27/19. Diagnostic mammogram on 03/02/19 showed calcifications in the upper-outer left breast measuring 2.0cm. Biopsy on 03/09/19 showed lobular carcinoma in situ. She underwent a left lumpectomy with Dr. Barry Dienes on 05/02/19 for which pathology showed atypical lobular hyperplasia and no evidence of invasive carcinoma. She presents to the clinic today for initial evaluation and discussion of treatment options.   I reviewed her records extensively and collaborated the history with the patient.  MEDICAL HISTORY:  Past Medical History:  Diagnosis Date  . Anginal pain ([place]) 11/2013  . ASCVD (arteriosclerotic cardiovascular disease)   . Coronary artery disease   . Dyslipidemia   . Hypothyroidism   . Menometrorrhagia   . Myocardial infarction ([place])   . PONV (postoperative nausea and vomiting)     SURGICAL HISTORY: Past Surgical History:  Procedure Laterality Date  . BREAST LUMPECTOMY WITH RADIOACTIVE SEED LOCALIZATION Left 05/02/2019   Procedure: LEFT BREAST LUMPECTOMY WITH RADIOACTIVE SEED LOCALIZATION;  Surgeon: Stark Klein, MD;  Location: [facility];  Service: General;  Laterality: Left;  . CORONARY ANGIOPLASTY  2015   atherectomy/DES LAD, DES D2 11/2013 [facility])  . EXCISION/RELEASE BURSA HIP Left 05/06/2016   Procedure: LEFT HIP ABDUCTOR REPAIR;  Surgeon: Rod Can, MD;  Location: [facility];  Service: Orthopedics;  Laterality: Left;  . HYSTEROSCOPY W/ ENDOMETRIAL ABLATION    . KNEE ARTHROSCOPY Left   . TONSILLECTOMY      SOCIAL HISTORY: Social History   Socioeconomic History  .  Marital status: Married    Spouse name: Not on file  . Number of children: Not on file  . Years of education: Not on file  . Highest education level: Not on file  Occupational History  . Not on file  Social Needs  . Financial resource strain: Not on file  . Food insecurity    Worry: Not on file    Inability: Not on file  . Transportation needs    Medical: Not on file    Non-medical: Not on file  Tobacco Use  . Smoking status: Never Smoker  . Smokeless tobacco: Never Used  Substance and Sexual Activity  . Alcohol use: No  . Drug use: No  . Sexual activity: Not on file  Lifestyle  . Physical activity    Days per week: Not on file    Minutes per session: Not on file  . Stress: Not on file  Relationships  . Social Herbalist on phone: Not on file    Gets together: Not on file    Attends religious service: Not on file    Active member of club or organization: Not on file    Attends meetings of clubs or organizations: Not on file    Relationship status: Not on file  . Intimate partner violence    Fear of current or ex partner: Not on file    Emotionally abused: Not on file    Physically abused: Not on file    Forced sexual activity: Not on file  Other Topics Concern  . Not on file  Social History Narrative  . Not on file  FAMILY HISTORY: Family History  Problem Relation Age of Onset  . Breast cancer Paternal Grandmother     ALLERGIES:  has No Known Allergies.  MEDICATIONS:  Current Outpatient Medications  Medication Sig Dispense Refill  . aspirin EC 81 MG tablet Take 81 mg by mouth daily.    Marland Kitchen atorvastatin (LIPITOR) 40 MG tablet Take 40 mg by mouth every evening.  3  . Cholecalciferol (VITAMIN D3) 50 MCG (2000 UT) TABS Take 2,000 Units by mouth daily.    Marland Kitchen levothyroxine (SYNTHROID, LEVOTHROID) 50 MCG tablet Take 50 mcg by mouth daily before breakfast.  2  . lisinopril (PRINIVIL,ZESTRIL) 5 MG tablet Take 5 mg by mouth daily.  2  . tamoxifen  (NOLVADEX) 20 MG tablet Take 1 tablet (20 mg total) by mouth daily. 90 tablet 1  . venlafaxine XR (EFFEXOR-XR) 75 MG 24 hr capsule Take 75 mg by mouth at bedtime.     No current facility-administered medications for this visit.     REVIEW OF SYSTEMS:   Constitutional: Denies fevers, chills or abnormal night sweats Eyes: Denies blurriness of vision, double vision or watery eyes Ears, nose, mouth, throat, and face: Denies mucositis or sore throat Respiratory: Denies cough, dyspnea or wheezes Cardiovascular: Denies palpitation, chest discomfort or lower extremity swelling Gastrointestinal:  Denies nausea, heartburn or change in bowel habits Skin: Denies abnormal skin rashes Lymphatics: Denies new lymphadenopathy or easy bruising Neurological:Denies numbness, tingling or new weaknesses Behavioral/Psych: Mood is stable, no new changes  Breast: Recent left lumpectomy All other systems were reviewed with the patient and are negative.  PHYSICAL EXAMINATION: ECOG PERFORMANCE STATUS: 1 - Symptomatic but completely ambulatory  Vitals:   06/12/19 1538  BP: 124/73  Pulse: 84  Resp: 17  Temp: 98 F (36.7 C)  SpO2: 100%   Filed Weights   06/12/19 1538  Weight: 188 lb 11.2 oz (85.6 kg)    GENERAL:alert, no distress and comfortable SKIN: skin color, texture, turgor are normal, no rashes or significant lesions EYES: normal, conjunctiva are pink and non-injected, sclera clear OROPHARYNX:no exudate, no erythema and lips, buccal mucosa, and tongue normal  NECK: supple, thyroid normal size, non-tender, without nodularity LYMPH:  no palpable lymphadenopathy in the cervical, axillary or inguinal LUNGS: clear to auscultation and percussion with normal breathing effort HEART: regular rate & rhythm and no murmurs and no lower extremity edema ABDOMEN:abdomen soft, non-tender and normal bowel sounds Musculoskeletal:no cyanosis of digits and no clubbing  PSYCH: alert & oriented x 3 with fluent  speech NEURO: no focal motor/sensory deficits    LABORATORY DATA:  I have reviewed the data as listed Lab Results  Component Value Date   WBC 6.1 04/27/2019   HGB 13.5 04/27/2019   HCT 41.0 04/27/2019   MCV 95.3 04/27/2019   PLT 297 04/27/2019   Lab Results  Component Value Date   NA 137 04/27/2019   K 4.0 04/27/2019   CL 104 04/27/2019   CO2 26 04/27/2019    RADIOGRAPHIC STUDIES: I have personally reviewed the radiological reports and agreed with the findings in the report.  ASSESSMENT AND PLAN:  Lobular carcinoma in situ (LCIS) of left breast (detected on biopsy) 05/02/2019: Left lumpectomy: Atypical lobular hyperplasia  Atypical lobular hyperplasia: This is characterized by abnormal cells that are filling part of the lobule.  It appears to increase the risk of breast cancer by 3 fold.  There is risk of both ipsilateral and contralateral breast cancers.  The cumulative incidence of breast cancer is approximately  1 %/year.   Based upon [employer] cancer risk assessment tool: 10-year risk of breast cancer is 10% (average woman's risk 2.5%); lifetime risk 34% (average woman's risk 9.5%) If LCIS was used in this algorithm, lifetime risk increased up to 54%.  In 10-year risk increased to 18%.  Risk reduction strategies:  I recommended appropriate lifestyle and dietary changes that include exercise, eating less red meat and increasing fruits and vegetables. Risk reduction with tamoxifen or raloxifene would reduce the risk by half.  Tamoxifen counseling: We discussed the risks and benefits of tamoxifen. These include but not limited to insomnia, hot flashes, mood changes, vaginal dryness, and weight gain. Although rare, serious side effects including endometrial cancer, risk of blood clots were also discussed. We strongly believe that the benefits far outweigh the risks. Patient understands these risks and consented to starting treatment. Planned treatment duration is 5 years.  Patient will start at 10 mg daily and may increase to 20 mg in a month if she tolerates it well.  Breast cancer surveillance: Annual mammograms, consider breast MRI because a lifetime risk of breast cancer risk of more than 20% along with breast exams. Breast MRI will be done in December 2020. Patient already takes Effexor for hot flashes. She has history of coronary artery disease and had an acute MI and takes aspirin currently.  Return to clinic in 3 months for toxicity evaluation on tamoxifen through with [employer] video call   All questions were answered. The patient knows to call the clinic with any problems, questions or concerns.   Rulon Eisenmenger, MD, MPH 06/12/2019    I, Molly Dorshimer, am acting as scribe for Nicholas Lose, MD.  I have reviewed the above documentation for accuracy and completeness, and I agree with the above.

## 2019-06-12 ENCOUNTER — Inpatient Hospital Stay: Payer: BC Managed Care – PPO | Attending: Hematology and Oncology | Admitting: Hematology and Oncology

## 2019-06-12 ENCOUNTER — Other Ambulatory Visit: Payer: Self-pay

## 2019-06-12 DIAGNOSIS — Z7982 Long term (current) use of aspirin: Secondary | ICD-10-CM | POA: Diagnosis not present

## 2019-06-12 DIAGNOSIS — Z79899 Other long term (current) drug therapy: Secondary | ICD-10-CM

## 2019-06-12 DIAGNOSIS — Z79810 Long term (current) use of selective estrogen receptor modulators (SERMs): Secondary | ICD-10-CM | POA: Diagnosis not present

## 2019-06-12 DIAGNOSIS — E785 Hyperlipidemia, unspecified: Secondary | ICD-10-CM

## 2019-06-12 DIAGNOSIS — E039 Hypothyroidism, unspecified: Secondary | ICD-10-CM | POA: Diagnosis not present

## 2019-06-12 DIAGNOSIS — Z803 Family history of malignant neoplasm of breast: Secondary | ICD-10-CM | POA: Diagnosis not present

## 2019-06-12 DIAGNOSIS — D0502 Lobular carcinoma in situ of left breast: Secondary | ICD-10-CM | POA: Diagnosis not present

## 2019-06-12 DIAGNOSIS — I2510 Atherosclerotic heart disease of native coronary artery without angina pectoris: Secondary | ICD-10-CM

## 2019-06-12 DIAGNOSIS — R232 Flushing: Secondary | ICD-10-CM | POA: Diagnosis not present

## 2019-06-12 DIAGNOSIS — I252 Old myocardial infarction: Secondary | ICD-10-CM

## 2019-06-12 DIAGNOSIS — Z1231 Encounter for screening mammogram for malignant neoplasm of breast: Secondary | ICD-10-CM

## 2019-06-12 DIAGNOSIS — N951 Menopausal and female climacteric states: Secondary | ICD-10-CM | POA: Diagnosis not present

## 2019-06-12 MED ORDER — TAMOXIFEN CITRATE 20 MG PO TABS: 20.0000 mg | ORAL_TABLET | Freq: Every day | ORAL | 1 refills | Status: DC

## 2019-06-12 NOTE — Assessment & Plan Note (Signed)
05/02/2019: Left lumpectomy: Atypical lobular hyperplasia  Atypical lobular hyperplasia: This is characterized by abnormal cells that are filling part of the lobule.  It appears to increase the risk of breast cancer by 3 fold.  There is risk of both ipsilateral and contralateral breast cancers.  The cumulative incidence of breast cancer is approximately 1 %/year.   Based upon [employer] cancer risk assessment tool: Risk of breast cancer is  Risk reduction strategies:  I recommended appropriate lifestyle and dietary changes that include exercise, eating less red meat and increasing fruits and vegetables. Risk reduction with tamoxifen or raloxifene would reduce the risk by half.  Tamoxifen counseling: We discussed the risks and benefits of tamoxifen. These include but not limited to insomnia, hot flashes, mood changes, vaginal dryness, and weight gain. Although rare, serious side effects including endometrial cancer, risk of blood clots were also discussed. We strongly believe that the benefits far outweigh the risks. Patient understands these risks and consented to starting treatment. Planned treatment duration is 5 years.  Breast cancer surveillance: Annual mammograms, consider breast MRI because a lifetime risk of breast cancer risk of more than 20% along with breast exams.  Return to clinic in 3 months for toxicity evaluation on tamoxifen.

## 2019-06-13 ENCOUNTER — Telehealth: Payer: Self-pay | Admitting: Hematology and Oncology

## 2019-06-13 NOTE — Telephone Encounter (Signed)
I left a message regarding schedule  

## 2019-07-30 ENCOUNTER — Ambulatory Visit
Admission: RE | Admit: 2019-07-30 | Discharge: 2019-07-30 | Disposition: A | Discharge status: Home / Self Care | Payer: BC Managed Care – PPO | Source: Ambulatory Visit | Attending: Hematology and Oncology | Admitting: Hematology and Oncology

## 2019-07-30 ENCOUNTER — Other Ambulatory Visit: Payer: Self-pay

## 2019-07-30 DIAGNOSIS — Z1231 Encounter for screening mammogram for malignant neoplasm of breast: Secondary | ICD-10-CM

## 2019-07-30 MED ORDER — GADOBUTROL 1 MMOL/ML IV SOLN
9.0000 mL | Freq: Once | INTRAVENOUS | Status: AC | PRN
  Administered 2019-07-30: 9 mL via INTRAVENOUS

## 2019-08-02 ENCOUNTER — Other Ambulatory Visit: Payer: Self-pay

## 2019-08-02 DIAGNOSIS — D0502 Lobular carcinoma in situ of left breast: Secondary | ICD-10-CM

## 2019-08-08 ENCOUNTER — Telehealth: Payer: Self-pay

## 2019-08-08 NOTE — Telephone Encounter (Signed)
Pt notified of need for biopsies of left breast based on MRI results.  Pt voiced understanding, [facility] aware and will contact patient for scheduling purposes.

## 2019-08-09 ENCOUNTER — Other Ambulatory Visit: Payer: Self-pay | Admitting: Hematology and Oncology

## 2019-08-09 DIAGNOSIS — D0502 Lobular carcinoma in situ of left breast: Secondary | ICD-10-CM

## 2019-08-30 ENCOUNTER — Ambulatory Visit
Admission: RE | Admit: 2019-08-30 | Discharge: 2019-08-30 | Disposition: A | Discharge status: Home / Self Care | Payer: BC Managed Care – PPO | Source: Ambulatory Visit | Attending: Hematology and Oncology | Admitting: Hematology and Oncology

## 2019-08-30 ENCOUNTER — Other Ambulatory Visit: Payer: Self-pay

## 2019-08-30 DIAGNOSIS — D0502 Lobular carcinoma in situ of left breast: Secondary | ICD-10-CM

## 2019-08-30 MED ORDER — GADOBUTROL 1 MMOL/ML IV SOLN
8.0000 mL | Freq: Once | INTRAVENOUS | Status: AC | PRN
  Administered 2019-08-30: 10:00:00 8 mL via INTRAVENOUS

## 2019-09-11 NOTE — Progress Notes (Signed)
HEMATOLOGY-ONCOLOGY DOXIMITY VISIT PROGRESS NOTE  I connected with Claire Briggs on 09/12/2019 at 11:30 AM EST by Doximity video conference and verified that I am speaking with the correct person using two identifiers.  I discussed the limitations, risks, security and privacy concerns of performing an evaluation and management service by Doximity and the availability of in person appointments.  I also discussed with the patient that there may be a patient responsible charge related to this service. The patient expressed understanding and agreed to proceed.  Patient's Location: Home Physician Location: Clinic  CHIEF COMPLIANT: Follow-up of left breast LCIS on tamoxifen   INTERVAL HISTORY: Claire Briggs is a 49 y.o. female with above-mentioned history of left breast LCIS. She underwent a lumpectomy and is currently on tamoxifen 18m daily. Breast MRI on 07/30/19 showed post-surgical changes in the left breast and non-mass enhancement extending toward the nipple. Biopsy on 08/30/19 showed usual ductal hyperplasia and no evidence of malignancy. She presents over Doximity today for follow-up.   Oncology History  Lobular carcinoma in situ (LCIS) of left breast  05/02/2019 Surgery   Left lumpectomy: Atypical lobular hyperplasia with fibroadenoma   06/2019 -  Anti-estrogen oral therapy   Tamoxifen 160mdaily     Observations/Objective:  There were no vitals filed for this visit. There is no height or weight on file to calculate BMI.  I have reviewed the data as listed CMP Latest Ref Rng & Units 04/27/2019  Glucose 70 - 99 mg/dL 92  BUN 6 - 20 mg/dL 14  Creatinine 0.44 - 1.00 mg/dL 0.78  Sodium 135 - 145 mmol/L 137  Potassium 3.5 - 5.1 mmol/L 4.0  Chloride 98 - 111 mmol/L 104  CO2 22 - 32 mmol/L 26  Calcium 8.9 - 10.3 mg/dL 8.8(L)    Lab Results  Component Value Date   WBC 6.1 04/27/2019   HGB 13.5 04/27/2019   HCT 41.0 04/27/2019   MCV 95.3 04/27/2019   PLT 297 04/27/2019        Assessment Plan:  Lobular carcinoma in situ (LCIS) of left breast 05/02/2019: Left lumpectomy: Atypical lobular hyperplasia Based upon [employer] [place] cancer risk assessment tool:  10-year risk of breast cancer is 10% (average woman's risk 2.5%);  lifetime risk 34% (average woman's risk 9.5%) If LCIS was used in this algorithm, lifetime risk increased up to 54%.  In 10-year risk increased to 18%.  Risk reduction: Tamoxifen started 06/12/2019 20 mg daily Tamoxifen toxicities: Patient experienced a hot flashes but is able to tolerate it.  Denies any other new symptoms or concerns.  Breast cancer surveillance: 1.  Mammograms 02/19/2019: Benign breast density category B 2.  Breast MRI 07/31/2019: Postsurgical changes left breast with non-mass enhancement, multiple 3 to 4 mm enhancing foci probable postoperative changes: Biopsy revealed UDH with intraductal papilloma Patient will be seeing Dr. ByBarry Dienesor recommendation.  It is possible that she may have a lumpectomy for the papilloma.  I will order the mammogram in July and the MRI in December of this year. Return to clinic in 1 year for follow-up     I discussed the assessment and treatment plan with the patient. The patient was provided an opportunity to ask questions and all were answered. The patient agreed with the plan and demonstrated an understanding of the instructions. The patient was advised to call back or seek an in-person evaluation if the symptoms worsen or if the condition fails to improve as anticipated.   I provided 20 minutes of  face-to-face Doximity time during this encounter.    Rulon Eisenmenger, MD 09/12/2019   I, Molly Dorshimer, am acting as scribe for Nicholas Lose, MD.  I have reviewed the above documentation for accuracy and completeness, and I agree with the above.

## 2019-09-12 ENCOUNTER — Inpatient Hospital Stay: Payer: BC Managed Care – PPO | Attending: Hematology and Oncology | Admitting: Hematology and Oncology

## 2019-09-12 ENCOUNTER — Ambulatory Visit: Payer: BC Managed Care – PPO | Admitting: Hematology and Oncology

## 2019-09-12 DIAGNOSIS — N951 Menopausal and female climacteric states: Secondary | ICD-10-CM | POA: Diagnosis not present

## 2019-09-12 DIAGNOSIS — Z79811 Long term (current) use of aromatase inhibitors: Secondary | ICD-10-CM | POA: Diagnosis not present

## 2019-09-12 DIAGNOSIS — D0502 Lobular carcinoma in situ of left breast: Secondary | ICD-10-CM

## 2019-09-12 NOTE — Assessment & Plan Note (Signed)
05/02/2019: Left lumpectomy: Atypical lobular hyperplasia Based upon Tyer [place] cancer risk assessment tool:  10-year risk of breast cancer is 10% (average woman's risk 2.5%);  lifetime risk 34% (average woman's risk 9.5%) If LCIS was used in this algorithm, lifetime risk increased up to 54%.  In 10-year risk increased to 18%.  Risk reduction: Tamoxifen started 06/12/2019 at 10 mg daily Tamoxifen toxicities:  Breast cancer surveillance: 1.  Mammograms 02/19/2019: Benign breast density category B 2.  Breast MRI 07/31/2019: Postsurgical changes left breast with non-mass enhancement, multiple 3 to 4 mm enhancing foci probable postoperative changes: Biopsy revealed UDH with intraductal papilloma  Return to clinic in 1 year for follow-up

## 2019-09-13 ENCOUNTER — Telehealth: Payer: Self-pay | Admitting: Hematology and Oncology

## 2019-09-13 ENCOUNTER — Other Ambulatory Visit: Payer: Self-pay | Admitting: *Deleted

## 2019-09-13 MED ORDER — LISINOPRIL 5 MG PO TABS: 5.0000 mg | ORAL_TABLET | Freq: Every day | ORAL | 2 refills | Status: DC

## 2019-09-13 NOTE — Telephone Encounter (Signed)
I talk with patient regarding schedule  

## 2020-01-04 ENCOUNTER — Other Ambulatory Visit: Payer: Self-pay

## 2020-01-04 ENCOUNTER — Telehealth: Payer: Self-pay

## 2020-01-04 MED ORDER — TAMOXIFEN CITRATE 20 MG PO TABS: 20.0000 mg | ORAL_TABLET | Freq: Every day | ORAL | 1 refills | Status: DC

## 2020-01-04 NOTE — Telephone Encounter (Signed)
Pt called and left message stating she needs refill for tamoxifen. Returned pt's call and LVM letting her know medication has been called in.

## 2020-02-28 ENCOUNTER — Ambulatory Visit
Admission: RE | Admit: 2020-02-28 | Discharge: 2020-02-28 | Disposition: A | Discharge status: Home / Self Care | Payer: BC Managed Care – PPO | Source: Ambulatory Visit | Attending: Hematology and Oncology | Admitting: Hematology and Oncology

## 2020-02-28 ENCOUNTER — Other Ambulatory Visit: Payer: Self-pay

## 2020-02-28 DIAGNOSIS — D0502 Lobular carcinoma in situ of left breast: Secondary | ICD-10-CM

## 2020-03-31 ENCOUNTER — Other Ambulatory Visit: Payer: Self-pay | Admitting: General Surgery

## 2020-03-31 DIAGNOSIS — R928 Other abnormal and inconclusive findings on diagnostic imaging of breast: Secondary | ICD-10-CM

## 2020-04-15 ENCOUNTER — Other Ambulatory Visit: Payer: Self-pay | Admitting: General Surgery

## 2020-04-15 DIAGNOSIS — R928 Other abnormal and inconclusive findings on diagnostic imaging of breast: Secondary | ICD-10-CM

## 2020-04-24 ENCOUNTER — Other Ambulatory Visit: Payer: Self-pay

## 2020-04-24 ENCOUNTER — Encounter (HOSPITAL_BASED_OUTPATIENT_CLINIC_OR_DEPARTMENT_OTHER): Payer: Self-pay | Admitting: General Surgery

## 2020-04-24 NOTE — Progress Notes (Signed)
Reviewed pt's cardiology notes and history with Dr Sabra Heck. No appointment or clearance needed prior to upcoming surgery with Dr  Barry Dienes. Pt denies any SOB, CP or palpitations on pre op call. Will hold aspirin starting 9/24. Covid test scheduled 04/25/20. Will come for PAT appt for EKG prior to seed placement.

## 2020-04-25 ENCOUNTER — Encounter (HOSPITAL_BASED_OUTPATIENT_CLINIC_OR_DEPARTMENT_OTHER)
Admission: RE | Admit: 2020-04-25 | Discharge: 2020-04-25 | Disposition: A | Discharge status: Home / Self Care | Payer: BC Managed Care – PPO | Source: Ambulatory Visit | Attending: General Surgery | Admitting: General Surgery

## 2020-04-25 ENCOUNTER — Other Ambulatory Visit (HOSPITAL_COMMUNITY)
Admission: RE | Admit: 2020-04-25 | Discharge: 2020-04-25 | Disposition: A | Discharge status: Home / Self Care | Payer: BC Managed Care – PPO | Source: Ambulatory Visit | Attending: General Surgery | Admitting: General Surgery

## 2020-04-25 DIAGNOSIS — Z20822 Contact with and (suspected) exposure to covid-19: Secondary | ICD-10-CM | POA: Insufficient documentation

## 2020-04-25 DIAGNOSIS — Z01818 Encounter for other preprocedural examination: Secondary | ICD-10-CM | POA: Diagnosis not present

## 2020-04-25 LAB — SARS CORONAVIRUS 2 (TAT 6-24 HRS): SARS Coronavirus 2: NEGATIVE

## 2020-04-25 MED ORDER — CHLORHEXIDINE GLUCONATE CLOTH 2 % EX PADS: 6.0000 | MEDICATED_PAD | Freq: Once | CUTANEOUS | Status: DC

## 2020-04-25 NOTE — Progress Notes (Signed)
° ° ° ° °  Enhanced Recovery after Surgery for Orthopedics Enhanced Recovery after Surgery is a protocol used to improve the stress on your body and your recovery after surgery.  Patient Instructions   The night before surgery:  o No food after midnight. ONLY clear liquids after midnight   The day of surgery (if you do NOT have diabetes):  o Drink ONE (1) Pre-Surgery Clear Ensure as directed.   o This drink was given to you during your hospital  pre-op appointment visit. o The pre-op nurse will instruct you on the time to drink the  Pre-Surgery Ensure depending on your surgery time. o Finish the drink at the designated time by the pre-op nurse.  o Nothing else to drink after completing the  Pre-Surgery Clear Ensure.   The day of surgery (if you have diabetes): o Drink ONE (1) Gatorade 2 (G2) as directed. o This drink was given to you during your hospital  pre-op appointment visit.  o The pre-op nurse will instruct you on the time to drink the   Gatorade 2 (G2) depending on your surgery time. o Color of the Gatorade may vary. Red is not allowed. o Nothing else to drink after completing the  Gatorade 2 (G2).         If you have questions, please contact your surgeons office.  Surgical soap given and instructions given to pt how to use it.  Pt verbalized understandings.

## 2020-04-28 ENCOUNTER — Ambulatory Visit
Admission: RE | Admit: 2020-04-28 | Discharge: 2020-04-28 | Disposition: A | Discharge status: Home / Self Care | Payer: BC Managed Care – PPO | Source: Ambulatory Visit | Attending: General Surgery | Admitting: General Surgery

## 2020-04-28 ENCOUNTER — Other Ambulatory Visit: Payer: Self-pay

## 2020-04-28 DIAGNOSIS — R928 Other abnormal and inconclusive findings on diagnostic imaging of breast: Secondary | ICD-10-CM

## 2020-04-28 NOTE — H&P (Signed)
Claire Briggs Appointment: 03/31/2020 12:15 PM Location: [facility] Patient #: [id number] DOB: 1971-07-31 Married / Language: Cleophus Molt / Race: White Female   History of Present Illness Stark Klein MD; 03/31/2020 7:09 PM) The patient is a 49 year old female who presents for a follow-up for Breast cancer. Pt is a lovely 49 yo F referred for consultation by Dr. Miquel Dunn for a diagnosis of left breast LCIS 03/2019. She had screening detected left breast calcifications, so dx mammo was performed. This showed 2x0.3x0.3 cm of concerning calcifications. She underwent core needle biopsy and was seen to have LCIS. She has no personal history of cancer. Her paternal grandmother had breast cancer in her 52s or 78s and then later had metastatic cancer of which she passed. They were not sure if the metastatic cancer was a recurrence of the breast cancer or a different cancer. She and her family have no knowledge of other cancers in the family line. She took OCPs for a bit but stopped them at least 5 years ago. She had an MI 4 years ago and knows she had been off them for a while. She is off platelet inhibitors (other than 81 mg ASA) at this point and is symptom free. Menarche was 33 and she is G2P2 with first child in early 85s.   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   She had seed localized lumpectomy 05/02/2019. She did not even pick up the narcotic. She just used tylenol. She is doing well. She did have some bruising.    She saw Dr. Lindi Adie to discuss high risk and initiation of tamoxifen (this occurred 06/2019). She also got an MRI to complete the workup. She had two areas biopsied in the upper outer left breast. These were a papilloma and a papillary lesion. Both of these were recommended to get excised by radiology. She has been having significant hot flashes with the tamoxifen. She was already on effexor for this and the dose has increased a bit which is  helping.   She is here for follow up and to discuss surgery. Repeat mammogram was done last month and nothing new was seen. She has no new health problems. She denies pain.   dx mammogram [id number] IMPRESSION: Stable biopsy marking clips within the upper-outer left breast at the site of papillary lesions.  Stable postsurgical changes upper outer left breast.  RECOMMENDATION: Surgical excision of the two sites of MRI guided core needle biopsy within the left breast demonstrating intraductal papilloma and papillary lesion.  If these sites are surgically excised, patient can have bilateral screening mammography in 12 months. If these sites are not surgically excised, patient would need follow-up evaluation in 6 months.  I have discussed the findings and recommendations with the patient. If applicable, a reminder letter will be sent to the patient regarding the next appointment.  BI-RADS CATEGORY 2: Benign.  MR breast bilat 07/31/2019 IMPRESSION: 1. Postsurgical changes in the upper-outer quadrant of the left breast with subtle, non-mass enhancement extending anteriorly from the lumpectomy site toward the nipple. There are multiple 3-4 mm enhancing foci in this region. While findings may represent postoperative changes, definitive tissue sampling is recommended given the patient's history of LCIS. Recommendation is for 2 area MRI guided biopsy at the anterior and posterior margins. 2. No MRI evidence of malignancy on the right. 3. No suspicious lymphadenopathy.  RECOMMENDATION: Two area MRI guided biopsy of the left breast.  BI-RADS CATEGORY 4: Suspicious.  pathology MR  biopsies 08/30/2019 1. Breast, left, needle core biopsy, UOQ anterior, stippled enhancement - USUAL DUCTAL HYPERPLASIA AND FIBROCYSTIC CHANGES - INTRADUCTAL PAPILLOMA (1 MM) - NO MALIGNANCY IDENTIFIED 2. Breast, left, needle core biopsy, UOQ posterior, enhancement - USUAL DUCTAL HYPERPLASIA  INVOLVING A PAPILLARY LESION (2 MM) - PREVIOUS SURGICAL SITE CHANGES WITH FOREIGN BODY GIANT CELL REACTION   pathology 05/02/2019 DIAGNOSIS:  A. BREAST, LEFT, LUMPECTOMY: - Lobular neoplasia (atypical lobular hyperplasia). - Fibroadenoma.   Allergies Emeline Gins, [place]; 03/31/2020 12:09 PM) No Known Drug Allergies  [03/26/2019]: Allergies Reconciled   Medication History Emeline Gins, CMA; 03/31/2020 12:09 PM) Tamoxifen Citrate (20MG Tablet, Oral) Active. Aspirin (81MG Tablet, Oral) Active. Atorvastatin Calcium (40MG Tablet, Oral) Active. Lisinopril (5MG Tablet, Oral) Active. Venlafaxine HCl ER (75MG Capsule ER 24HR, Oral) Active. Levothyroxine Sodium (50MCG Tablet, Oral) Active. Medications Reconciled    Review of Systems Stark Klein MD; 03/31/2020 7:09 PM) All other systems negative  Vitals Emeline Gins CMA; 03/31/2020 12:09 PM) 03/31/2020 12:09 PM Weight: 178.2 lb Height: 66in Body Surface Area: 1.9 m Body Mass Index: 28.76 kg/m  Temp.: 98.35F  Pulse: 92 (Regular)  BP: 128/76(Sitting, Left Arm, Standard)       Physical Exam Stark Klein MD; 03/31/2020 7:10 PM) General Mental Status-Alert. General Appearance-Consistent with stated age. Hydration-Well hydrated. Voice-Normal.  Head and Neck Head-normocephalic, atraumatic with no lesions or palpable masses.  Eye Sclera/Conjunctiva - Bilateral-No scleral icterus.  Chest and Lung Exam Chest and lung exam reveals -quiet, even and easy respiratory effort with no use of accessory muscles. Inspection Chest Wall - Normal. Back - normal.  Breast Note: left axillary incision with small amount of thickening. Cannot appreciate a size discrepancy in the breasts. no palpable left breast mass. no nipple retraction or nipple discharge. No skin dimpling. no LAD. right breast normal.   Neurologic Neurologic evaluation reveals -alert and oriented x 3 with no impairment of  recent or remote memory. Mental Status-Normal.  Musculoskeletal Global Assessment -Note: no gross deformities.  Normal Exam - Left-Upper Extremity Strength Normal and Lower Extremity Strength Normal. Normal Exam - Right-Upper Extremity Strength Normal and Lower Extremity Strength Normal.  Lymphatic Head & Neck  General Head & Neck Lymphatics: Bilateral - Description - Normal. Axillary  General Axillary Region: Bilateral - Description - Normal. Tenderness - Non Tender.    Assessment & Plan Stark Klein MD; 03/31/2020 7:11 PM) ABNORMAL MRI, BREAST (R92.8) Impression: Will plan seed localized excision of both lesions, the papilloma and the other papillary lesion.  Hopefully can do these via the axillary incision.  discussed risks and recovery.  she is being careful at her medical primary care office to avoid covid. Current Plans Pt Education - [facility] Breast Biopsy HCI: discussed with patient and provided information. LOBULAR CARCINOMA IN SITU (LCIS) OF LEFT BREAST (D05.02) Impression: Continue tamoxifen.  Mammogram due 01/2021

## 2020-04-29 ENCOUNTER — Ambulatory Visit (HOSPITAL_BASED_OUTPATIENT_CLINIC_OR_DEPARTMENT_OTHER): Payer: BC Managed Care – PPO | Admitting: Anesthesiology

## 2020-04-29 ENCOUNTER — Ambulatory Visit
Admission: RE | Admit: 2020-04-29 | Discharge: 2020-04-29 | Disposition: A | Discharge status: Home / Self Care | Payer: BC Managed Care – PPO | Source: Ambulatory Visit | Attending: General Surgery | Admitting: General Surgery

## 2020-04-29 ENCOUNTER — Ambulatory Visit (HOSPITAL_BASED_OUTPATIENT_CLINIC_OR_DEPARTMENT_OTHER)
Admission: RE | Admit: 2020-04-29 | Discharge: 2020-04-29 | Disposition: A | Discharge status: Home / Self Care | Payer: BC Managed Care – PPO | Attending: General Surgery | Admitting: General Surgery

## 2020-04-29 ENCOUNTER — Encounter (HOSPITAL_BASED_OUTPATIENT_CLINIC_OR_DEPARTMENT_OTHER): Payer: Self-pay | Admitting: General Surgery

## 2020-04-29 ENCOUNTER — Other Ambulatory Visit: Payer: Self-pay

## 2020-04-29 ENCOUNTER — Encounter (HOSPITAL_BASED_OUTPATIENT_CLINIC_OR_DEPARTMENT_OTHER)
Admission: RE | Disposition: A | Discharge status: Home / Self Care | Payer: Self-pay | Source: Home / Self Care | Attending: General Surgery

## 2020-04-29 DIAGNOSIS — Z7982 Long term (current) use of aspirin: Secondary | ICD-10-CM | POA: Diagnosis not present

## 2020-04-29 DIAGNOSIS — D242 Benign neoplasm of left breast: Secondary | ICD-10-CM | POA: Insufficient documentation

## 2020-04-29 DIAGNOSIS — I252 Old myocardial infarction: Secondary | ICD-10-CM | POA: Diagnosis not present

## 2020-04-29 DIAGNOSIS — N62 Hypertrophy of breast: Secondary | ICD-10-CM | POA: Insufficient documentation

## 2020-04-29 DIAGNOSIS — I10 Essential (primary) hypertension: Secondary | ICD-10-CM | POA: Diagnosis not present

## 2020-04-29 DIAGNOSIS — Z79899 Other long term (current) drug therapy: Secondary | ICD-10-CM | POA: Insufficient documentation

## 2020-04-29 DIAGNOSIS — I2510 Atherosclerotic heart disease of native coronary artery without angina pectoris: Secondary | ICD-10-CM | POA: Insufficient documentation

## 2020-04-29 DIAGNOSIS — N649 Disorder of breast, unspecified: Secondary | ICD-10-CM | POA: Diagnosis present

## 2020-04-29 DIAGNOSIS — Z803 Family history of malignant neoplasm of breast: Secondary | ICD-10-CM | POA: Insufficient documentation

## 2020-04-29 DIAGNOSIS — Z79810 Long term (current) use of selective estrogen receptor modulators (SERMs): Secondary | ICD-10-CM | POA: Diagnosis not present

## 2020-04-29 DIAGNOSIS — R928 Other abnormal and inconclusive findings on diagnostic imaging of breast: Secondary | ICD-10-CM

## 2020-04-29 DIAGNOSIS — N6022 Fibroadenosis of left breast: Secondary | ICD-10-CM | POA: Diagnosis not present

## 2020-04-29 HISTORY — PX: BREAST LUMPECTOMY WITH RADIOACTIVE SEED LOCALIZATION: SHX6424

## 2020-04-29 LAB — POCT PREGNANCY, URINE: Preg Test, Ur: NEGATIVE

## 2020-04-29 SURGERY — BREAST LUMPECTOMY WITH RADIOACTIVE SEED LOCALIZATION
Anesthesia: General | Site: Breast | Laterality: Left

## 2020-04-29 MED ORDER — DEXAMETHASONE SODIUM PHOSPHATE 4 MG/ML IJ SOLN
INTRAMUSCULAR | Status: DC | PRN
  Administered 2020-04-29: 4 mg via INTRAVENOUS

## 2020-04-29 MED ORDER — PROPOFOL 500 MG/50ML IV EMUL
INTRAVENOUS | Status: DC | PRN
  Administered 2020-04-29: 25 ug/kg/min via INTRAVENOUS

## 2020-04-29 MED ORDER — OXYCODONE HCL 5 MG PO TABS: 5.0000 mg | ORAL_TABLET | Freq: Once | ORAL | Status: DC | PRN

## 2020-04-29 MED ORDER — LACTATED RINGERS IV SOLN: INTRAVENOUS | Status: DC

## 2020-04-29 MED ORDER — CEFAZOLIN SODIUM-DEXTROSE 2-4 GM/100ML-% IV SOLN
2.0000 g | INTRAVENOUS | Status: AC
  Administered 2020-04-29: 2 g via INTRAVENOUS

## 2020-04-29 MED ORDER — LIDOCAINE-EPINEPHRINE (PF) 1 %-1:200000 IJ SOLN
INTRAMUSCULAR | Status: DC | PRN
  Administered 2020-04-29: 30 mL

## 2020-04-29 MED ORDER — CEFAZOLIN SODIUM-DEXTROSE 2-4 GM/100ML-% IV SOLN
INTRAVENOUS | Status: AC
  Filled 2020-04-29: qty 100

## 2020-04-29 MED ORDER — ACETAMINOPHEN 500 MG PO TABS
ORAL_TABLET | ORAL | Status: AC
  Filled 2020-04-29: qty 2

## 2020-04-29 MED ORDER — PROMETHAZINE HCL 25 MG/ML IJ SOLN: 6.2500 mg | INTRAMUSCULAR | Status: DC | PRN

## 2020-04-29 MED ORDER — MIDAZOLAM HCL 2 MG/2ML IJ SOLN
INTRAMUSCULAR | Status: AC
  Filled 2020-04-29: qty 2

## 2020-04-29 MED ORDER — FENTANYL CITRATE (PF) 100 MCG/2ML IJ SOLN
INTRAMUSCULAR | Status: DC | PRN
Start: 2020-04-29 — End: 2020-04-29
  Administered 2020-04-29 (×2): 50 ug via INTRAVENOUS

## 2020-04-29 MED ORDER — MEPERIDINE HCL 25 MG/ML IJ SOLN: 6.2500 mg | INTRAMUSCULAR | Status: DC | PRN

## 2020-04-29 MED ORDER — OXYCODONE HCL 5 MG PO TABS: 5.0000 mg | ORAL_TABLET | Freq: Four times a day (QID) | ORAL | 0 refills | Status: DC | PRN

## 2020-04-29 MED ORDER — ONDANSETRON HCL 4 MG/2ML IJ SOLN
INTRAMUSCULAR | Status: DC | PRN
  Administered 2020-04-29: 4 mg via INTRAVENOUS

## 2020-04-29 MED ORDER — DROPERIDOL 2.5 MG/ML IJ SOLN
INTRAMUSCULAR | Status: AC
  Filled 2020-04-29: qty 2

## 2020-04-29 MED ORDER — LIDOCAINE 2% (20 MG/ML) 5 ML SYRINGE
INTRAMUSCULAR | Status: AC
  Filled 2020-04-29: qty 5

## 2020-04-29 MED ORDER — SCOPOLAMINE 1 MG/3DAYS TD PT72
1.0000 | MEDICATED_PATCH | TRANSDERMAL | Status: DC
  Administered 2020-04-29: 1.5 mg via TRANSDERMAL

## 2020-04-29 MED ORDER — SCOPOLAMINE 1 MG/3DAYS TD PT72
MEDICATED_PATCH | TRANSDERMAL | Status: AC
  Filled 2020-04-29: qty 1

## 2020-04-29 MED ORDER — DROPERIDOL 2.5 MG/ML IJ SOLN
INTRAMUSCULAR | Status: DC | PRN
  Administered 2020-04-29: .625 mg via INTRAVENOUS

## 2020-04-29 MED ORDER — LIDOCAINE HCL (CARDIAC) PF 100 MG/5ML IV SOSY
PREFILLED_SYRINGE | INTRAVENOUS | Status: DC | PRN
  Administered 2020-04-29: 60 mg via INTRAVENOUS

## 2020-04-29 MED ORDER — MIDAZOLAM HCL 5 MG/5ML IJ SOLN
INTRAMUSCULAR | Status: DC | PRN
  Administered 2020-04-29: 2 mg via INTRAVENOUS

## 2020-04-29 MED ORDER — ACETAMINOPHEN 500 MG PO TABS
1000.0000 mg | ORAL_TABLET | ORAL | Status: AC
  Administered 2020-04-29: 1000 mg via ORAL

## 2020-04-29 MED ORDER — HYDROMORPHONE HCL 1 MG/ML IJ SOLN: 0.2500 mg | INTRAMUSCULAR | Status: DC | PRN

## 2020-04-29 MED ORDER — FENTANYL CITRATE (PF) 100 MCG/2ML IJ SOLN
INTRAMUSCULAR | Status: AC
  Filled 2020-04-29: qty 2

## 2020-04-29 MED ORDER — PROPOFOL 10 MG/ML IV BOLUS
INTRAVENOUS | Status: DC | PRN
  Administered 2020-04-29: 200 mg via INTRAVENOUS

## 2020-04-29 MED ORDER — OXYCODONE HCL 5 MG/5ML PO SOLN: 5.0000 mg | Freq: Once | ORAL | Status: DC | PRN

## 2020-04-29 SURGICAL SUPPLY — 55 items
ADH SKN CLS APL DERMABOND .7 (GAUZE/BANDAGES/DRESSINGS) ×1
APL PRP STRL LF DISP 70% ISPRP (MISCELLANEOUS) ×1
BINDER BREAST LRG (GAUZE/BANDAGES/DRESSINGS) IMPLANT
BINDER BREAST MEDIUM (GAUZE/BANDAGES/DRESSINGS) IMPLANT
BINDER BREAST XLRG (GAUZE/BANDAGES/DRESSINGS) ×3 IMPLANT
BINDER BREAST XXLRG (GAUZE/BANDAGES/DRESSINGS) IMPLANT
BLADE SURG 10 STRL SS (BLADE) ×3 IMPLANT
BLADE SURG 15 STRL LF DISP TIS (BLADE) IMPLANT
BLADE SURG 15 STRL SS (BLADE)
CANISTER SUC SOCK COL 7IN (MISCELLANEOUS) IMPLANT
CANISTER SUCT 1200ML W/VALVE (MISCELLANEOUS) IMPLANT
CHLORAPREP W/TINT 26 (MISCELLANEOUS) ×3 IMPLANT
CLIP VESOCCLUDE LG 6/CT (CLIP) ×3 IMPLANT
CLIP VESOCCLUDE MED 6/CT (CLIP) IMPLANT
CLOSURE WOUND 1/2 X4 (GAUZE/BANDAGES/DRESSINGS) ×1
COVER BACK TABLE 60X90IN (DRAPES) ×3 IMPLANT
COVER MAYO STAND STRL (DRAPES) ×3 IMPLANT
COVER PROBE W GEL 5X96 (DRAPES) ×3 IMPLANT
COVER WAND RF STERILE (DRAPES) IMPLANT
DECANTER SPIKE VIAL GLASS SM (MISCELLANEOUS) IMPLANT
DERMABOND ADVANCED (GAUZE/BANDAGES/DRESSINGS) ×2
DERMABOND ADVANCED .7 DNX12 (GAUZE/BANDAGES/DRESSINGS) ×1 IMPLANT
DRAPE LAPAROSCOPIC ABDOMINAL (DRAPES) ×3 IMPLANT
DRAPE UTILITY XL STRL (DRAPES) ×3 IMPLANT
ELECT COATED BLADE 2.86 ST (ELECTRODE) ×3 IMPLANT
ELECT REM PT RETURN 9FT ADLT (ELECTROSURGICAL) ×3
ELECTRODE REM PT RTRN 9FT ADLT (ELECTROSURGICAL) ×1 IMPLANT
GAUZE SPONGE 4X4 12PLY STRL LF (GAUZE/BANDAGES/DRESSINGS) ×3 IMPLANT
GLOVE BIO SURGEON STRL SZ 6 (GLOVE) ×3 IMPLANT
GLOVE BIOGEL PI IND STRL 6.5 (GLOVE) ×1 IMPLANT
GLOVE BIOGEL PI INDICATOR 6.5 (GLOVE) ×2
GOWN STRL REUS W/ TWL LRG LVL3 (GOWN DISPOSABLE) ×1 IMPLANT
GOWN STRL REUS W/TWL 2XL LVL3 (GOWN DISPOSABLE) ×3 IMPLANT
GOWN STRL REUS W/TWL LRG LVL3 (GOWN DISPOSABLE) ×3
KIT MARKER MARGIN INK (KITS) ×3 IMPLANT
LIGHT WAVEGUIDE WIDE FLAT (MISCELLANEOUS) ×3 IMPLANT
NEEDLE HYPO 25X1 1.5 SAFETY (NEEDLE) ×3 IMPLANT
NS IRRIG 1000ML POUR BTL (IV SOLUTION) ×3 IMPLANT
PACK BASIN DAY SURGERY FS (CUSTOM PROCEDURE TRAY) ×3 IMPLANT
PENCIL SMOKE EVACUATOR (MISCELLANEOUS) ×3 IMPLANT
SLEEVE SCD COMPRESS KNEE MED (MISCELLANEOUS) ×3 IMPLANT
SPONGE LAP 18X18 RF (DISPOSABLE) ×3 IMPLANT
STRIP CLOSURE SKIN 1/2X4 (GAUZE/BANDAGES/DRESSINGS) ×2 IMPLANT
SUT MNCRL AB 4-0 PS2 18 (SUTURE) ×3 IMPLANT
SUT SILK 2 0 SH (SUTURE) IMPLANT
SUT VIC AB 2-0 SH 27 (SUTURE) ×3
SUT VIC AB 2-0 SH 27XBRD (SUTURE) ×1 IMPLANT
SUT VIC AB 3-0 SH 27 (SUTURE) ×3
SUT VIC AB 3-0 SH 27X BRD (SUTURE) ×1 IMPLANT
SYR CONTROL 10ML LL (SYRINGE) ×3 IMPLANT
TOWEL GREEN STERILE FF (TOWEL DISPOSABLE) ×3 IMPLANT
TRAY FAXITRON CT DISP (TRAY / TRAY PROCEDURE) ×3 IMPLANT
TUBE CONNECTING 20'X1/4 (TUBING)
TUBE CONNECTING 20X1/4 (TUBING) IMPLANT
YANKAUER SUCT BULB TIP NO VENT (SUCTIONS) IMPLANT

## 2020-04-29 NOTE — Anesthesia Procedure Notes (Signed)
Procedure Name: LMA Insertion Date/Time: 04/29/2020 3:33 PM Performed by: Signe Colt, CRNA Pre-anesthesia Checklist: Patient identified, Emergency Drugs available, Suction available and Patient being monitored Patient Re-evaluated:Patient Re-evaluated prior to induction Oxygen Delivery Method: Circle system utilized Preoxygenation: Pre-oxygenation with 100% oxygen Induction Type: IV induction Ventilation: Mask ventilation without difficulty LMA: LMA inserted LMA Size: 4.0 Number of attempts: 1 Airway Equipment and Method: Bite block Placement Confirmation: positive ETCO2 Tube secured with: Tape Dental Injury: Teeth and Oropharynx as per pre-operative assessment

## 2020-04-29 NOTE — Discharge Instructions (Addendum)
May take Tylenol at or after 7:30pm if needed.  [place] Office Phone Number [phone]  BREAST BIOPSY/ PARTIAL MASTECTOMY: POST OP INSTRUCTIONS  Always review your discharge instruction sheet given to you by the facility where your surgery was performed.  IF YOU HAVE DISABILITY OR FAMILY LEAVE FORMS, YOU MUST BRING THEM TO THE OFFICE FOR PROCESSING.  DO NOT GIVE THEM TO YOUR DOCTOR.  1. A prescription for pain medication may be given to you upon discharge.  Take your pain medication as prescribed, if needed.  If narcotic pain medicine is not needed, then you may take acetaminophen (Tylenol) or ibuprofen (Advil) as needed. 2. Take your usually prescribed medications unless otherwise directed 3. If you need a refill on your pain medication, please contact your pharmacy.  They will contact our office to request authorization.  Prescriptions will not be filled after 5pm or on week-ends. 4. You should eat very light the first 24 hours after surgery, such as soup, crackers, pudding, etc.  Resume your normal diet the day after surgery. 5. Most patients will experience some swelling and bruising in the breast.  Ice packs and a good support bra will help.  Swelling and bruising can take several days to resolve.  6. It is common to experience some constipation if taking pain medication after surgery.  Increasing fluid intake and taking a stool softener will usually help or prevent this problem from occurring.  A mild laxative (Milk of Magnesia or Miralax) should be taken according to package directions if there are no bowel movements after 48 hours. 7. Unless discharge instructions indicate otherwise, you may remove your bandages 48 hours after surgery, and you may shower at that time.  You may have steri-strips (small skin tapes) in place directly over the incision.  These strips should be left on the skin for 7-10 days.   Any sutures or staples will be removed at the office during your  follow-up visit. 8. ACTIVITIES:  You may resume regular daily activities (gradually increasing) beginning the next day.  Wearing a good support bra or sports bra (or the breast binder) minimizes pain and swelling.  You may have sexual intercourse when it is comfortable. a. You may drive when you no longer are taking prescription pain medication, you can comfortably wear a seatbelt, and you can safely maneuver your car and apply brakes. b. RETURN TO WORK:  __________1 week_______________ 9. You should see your doctor in the office for a follow-up appointment approximately two weeks after your surgery.  Your doctor's nurse will typically make your follow-up appointment when she calls you with your pathology report.  Expect your pathology report 2-3 business days after your surgery.  You may call to check if you do not hear from [place] after three days.   WHEN TO CALL YOUR DOCTOR: 1. Fever over 101.0 2. Nausea and/or vomiting. 3. Extreme swelling or bruising. 4. Continued bleeding from incision. 5. Increased pain, redness, or drainage from the incision.  The clinic staff is available to answer your questions during regular business hours.  Please don't hesitate to call and ask to speak to one of the nurses for clinical concerns.  If you have a medical emergency, go to the nearest emergency room or call 911.  A surgeon from [facility] is always on call at the hospital.  For further questions, please visit [email or web address]     Post Anesthesia Home Care Instructions  Activity: Get plenty of rest for the remainder of  the day. A responsible individual must stay with you for 24 hours following the procedure.  For the next 24 hours, DO NOT: -Drive a car -Paediatric nurse -Drink alcoholic beverages -Take any medication unless instructed by your physician -Make any legal decisions or sign important papers.  Meals: Start with liquid foods such as gelatin or soup. Progress to  regular foods as tolerated. Avoid greasy, spicy, heavy foods. If nausea and/or vomiting occur, drink only clear liquids until the nausea and/or vomiting subsides. Call your physician if vomiting continues.  Special Instructions/Symptoms: Your throat may feel dry or sore from the anesthesia or the breathing tube placed in your throat during surgery. If this causes discomfort, gargle with warm salt water. The discomfort should disappear within 24 hours.  If you had a scopolamine patch placed behind your ear for the management of post- operative nausea and/or vomiting:  1. The medication in the patch is effective for 72 hours, after which it should be removed.  Wrap patch in a tissue and discard in the trash. Wash hands thoroughly with soap and water. 2. You may remove the patch earlier than 72 hours if you experience unpleasant side effects which may include dry mouth, dizziness or visual disturbances. 3. Avoid touching the patch. Wash your hands with soap and water after contact with the patch.

## 2020-04-29 NOTE — Anesthesia Postprocedure Evaluation (Signed)
Anesthesia Post Note  Patient: Claire Briggs  Procedure(s) Performed: RADIOACTIVE SEED GUIDED LEFT BREAST LUMPECTOMY TIMES TWO (Left Breast)     Patient location during evaluation: PACU Anesthesia Type: General Level of consciousness: awake and alert Pain management: pain level controlled Vital Signs Assessment: post-procedure vital signs reviewed and stable Respiratory status: spontaneous breathing, nonlabored ventilation and respiratory function stable Cardiovascular status: blood pressure returned to baseline and stable Postop Assessment: no apparent nausea or vomiting Anesthetic complications: no   No complications documented.  Last Vitals:  Vitals:   04/29/20 1642 04/29/20 1645  BP:  119/74  Pulse: 84 83  Resp: 17 17  Temp: 36.6 C   SpO2: 100% 100%    Last Pain:  Vitals:   04/29/20 1642  TempSrc:   PainSc: 0-No pain                 Lynda Rainwater

## 2020-04-29 NOTE — Anesthesia Preprocedure Evaluation (Signed)
Anesthesia Evaluation  Patient identified by MRN, date of birth, ID band Patient awake    Reviewed: Allergy & Precautions, NPO status , Patient's Chart, lab work & pertinent test results  History of Anesthesia Complications (+) PONV and history of anesthetic complications  Airway Mallampati: II  TM Distance: >3 FB Neck ROM: Full    Dental no notable dental hx. (+) Teeth Intact, Dental Advisory Given   Pulmonary neg pulmonary ROS,    Pulmonary exam normal breath sounds clear to auscultation       Cardiovascular hypertension, Pt. 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 normal exercise stress echo in 05/2015.    Neuro/Psych negative neurological ROS  negative psych ROS   GI/Hepatic negative GI ROS, Neg liver ROS,   Endo/Other  negative endocrine ROSHypothyroidism   Renal/GU negative Renal ROS  negative genitourinary   Musculoskeletal negative musculoskeletal ROS (+)   Abdominal   Peds  Hematology negative hematology ROS (+)   Anesthesia Other Findings   Reproductive/Obstetrics negative OB ROS                                                              Anesthesia Evaluation  Patient identified by MRN, date of birth, ID band Patient awake    Reviewed: Allergy & Precautions, NPO status , Patient's Chart, lab work & pertinent test results  Airway Mallampati: I  TM Distance: >3 FB Neck ROM: Full    Dental  (+) Teeth Intact, Dental Advisory Given   Pulmonary neg pulmonary ROS,    breath sounds clear to auscultation       Cardiovascular hypertension, Pt. on medications + CAD, + Past MI and + Cardiac Stents   Rhythm:Regular Rate:Normal     Neuro/Psych negative  neurological ROS  negative psych ROS   GI/Hepatic negative GI ROS, Neg liver ROS,   Endo/Other  Hypothyroidism   Renal/GU negative Renal ROS  negative genitourinary   Musculoskeletal negative musculoskeletal ROS (+)   Abdominal   Peds negative pediatric ROS (+)  Hematology negative hematology ROS (+)   Anesthesia Other Findings   Reproductive/Obstetrics negative OB ROS                           Anesthesia Physical Anesthesia Plan  ASA: II  Anesthesia Plan: General   Post-op Pain Management:    Induction: Intravenous  Airway Management Planned: Oral ETT  Additional Equipment:   Intra-op Plan:   Post-operative Plan: Extubation in OR  Informed Consent: I have reviewed the patients History and Physical, chart, labs and discussed the procedure including the risks, benefits and alternatives for the proposed anesthesia with the patient or authorized representative who has indicated his/her understanding and acceptance.   Dental advisory given  Plan Discussed with: CRNA  Anesthesia Plan Comments:        Anesthesia Quick Evaluation  Anesthesia Physical  Anesthesia Plan  ASA: III  Anesthesia Plan: General   Post-op Pain Management:    Induction: Intravenous  PONV Risk Score and Plan: 4 or greater and  Ondansetron, Dexamethasone, Treatment may vary due to age or medical condition, Midazolam and Droperidol  Airway Management Planned: LMA  Additional Equipment: None  Intra-op Plan:   Post-operative Plan: Extubation in OR  Informed Consent: I have reviewed the patients History and Physical, chart, labs and discussed the procedure including the risks, benefits and alternatives for the proposed anesthesia with the patient or authorized representative who has indicated his/her understanding and acceptance.     Dental advisory given  Plan Discussed with: CRNA  Anesthesia Plan Comments: ( )        Anesthesia Quick  Evaluation

## 2020-04-29 NOTE — Transfer of Care (Signed)
Immediate Anesthesia Transfer of Care Note  Patient: Claire Briggs  Procedure(s) Performed: RADIOACTIVE SEED GUIDED LEFT BREAST LUMPECTOMY TIMES TWO (Left Breast)  Patient Location: PACU  Anesthesia Type:General  Level of Consciousness: awake, alert , oriented and patient cooperative  Airway & Oxygen Therapy: Patient Spontanous Breathing and Patient connected to face mask oxygen  Post-op Assessment: Report given to RN and Post -op Vital signs reviewed and stable  Post vital signs: Reviewed and stable  Last Vitals:  Vitals Value Taken Time  BP    Temp    Pulse 81 04/29/20 1641  Resp    SpO2 100 % 04/29/20 1641  Vitals shown include unvalidated device data.  Last Pain:  Vitals:   04/29/20 1329  TempSrc: Oral  PainSc: 0-No pain         Complications: No complications documented.

## 2020-04-29 NOTE — Interval H&P Note (Signed)
History and Physical Interval Note:  04/29/2020 3:21 PM  Claire Briggs  has presented today for surgery, with the diagnosis of LEFT BREAST ABNNORMAL MAMMOGRAM.  The various methods of treatment have been discussed with the patient and family. After consideration of risks, benefits and other options for treatment, the patient has consented to  Procedure(s) with comments: LEFT BREAST LUMPECTOMY X 2  WITH RADIOACTIVE SEED LOCALIZATION (Left) - RNFA as a surgical intervention.  The patient's history has been reviewed, patient examined, no change in status, stable for surgery.  I have reviewed the patient's chart and labs.  Questions were answered to the patient's satisfaction.     Stark Klein

## 2020-04-29 NOTE — Op Note (Signed)
Left Breast Radioactive seed localized excisional biopsy x2  Indications: This patient presents with history of abnormal left breast MRI mammogram with discordant core needle biopsy.    Pre-operative Diagnosis: abnormal Left breast MRI  Post-operative Diagnosis: Same  Surgeon: Stark Klein   Anesthesia: General endotracheal anesthesia  ASA Class: 2  Procedure Details  The patient was seen in the Holding Room. The risks, benefits, complications, treatment options, and expected outcomes were discussed with the patient. The possibilities of bleeding, infection, the need for additional procedures, failure to diagnose a condition, and creating a complication requiring transfusion or operation were discussed with the patient. The patient concurred with the proposed plan, giving informed consent.  The site of surgery properly noted/marked. The patient was taken to Operating Room # 2, identified, and the procedure verified as Left Breast seed localized excisional biopsy x 2. A Time Out was held and the above information confirmed.  The left breast and chest were prepped and draped in standard fashion. A lateral circumareolar incision was made near the previously placed radioactive seeds.  Dissection was carried down around the point of maximum signal intensity. The cautery was used to perform the dissection.   Both seeds were present in the specimen. The specimen was inked with the margin marker paint kit.    Specimen radiography confirmed inclusion of the mammographic lesion, the clip(2), and the seed.  The background signal in the breast was zero.  One clip was placed in the breast cavity.  Hemostasis was achieved with cautery.  The wound was irrigated and closed with 3-0 vicryl interrupted deep dermal sutures and 4-0 monocryl running subcuticular suture.      Sterile dressings were applied. At the end of the operation, all sponge, instrument, and needle counts were correct.  Findings: Seed, clip in  specimen.  Anterior margins is skin  Estimated Blood Loss:  min         Specimens: left breast tissue with seeds         Complications:  None; patient tolerated the procedure well.         Disposition: PACU - hemodynamically stable.         Condition: stable

## 2020-05-01 ENCOUNTER — Encounter (HOSPITAL_BASED_OUTPATIENT_CLINIC_OR_DEPARTMENT_OTHER): Payer: Self-pay | Admitting: General Surgery

## 2020-05-01 LAB — SURGICAL PATHOLOGY

## 2020-06-28 ENCOUNTER — Other Ambulatory Visit: Payer: Self-pay | Admitting: Hematology and Oncology

## 2020-07-31 ENCOUNTER — Other Ambulatory Visit: Payer: BC Managed Care – PPO

## 2020-08-15 ENCOUNTER — Ambulatory Visit
Admission: RE | Admit: 2020-08-15 | Discharge: 2020-08-15 | Disposition: A | Discharge status: Home / Self Care | Payer: BC Managed Care – PPO | Source: Ambulatory Visit | Attending: Hematology and Oncology | Admitting: Hematology and Oncology

## 2020-08-15 ENCOUNTER — Other Ambulatory Visit: Payer: Self-pay

## 2020-08-15 DIAGNOSIS — D0502 Lobular carcinoma in situ of left breast: Secondary | ICD-10-CM

## 2020-08-15 MED ORDER — GADOBUTROL 1 MMOL/ML IV SOLN
6.0000 mL | Freq: Once | INTRAVENOUS | Status: AC | PRN
  Administered 2020-08-15: 6 mL via INTRAVENOUS

## 2020-09-05 ENCOUNTER — Other Ambulatory Visit: Payer: Self-pay | Admitting: Hematology and Oncology

## 2020-09-10 NOTE — Progress Notes (Signed)
HEMATOLOGY-ONCOLOGY MYCHART VIDEO VISIT PROGRESS NOTE  I connected with Claire Briggs on 09/11/2020 at  8:45 AM EST by MyChart video conference and verified that I am speaking with the correct person using two identifiers.  I discussed the limitations, risks, security and privacy concerns of performing an evaluation and management service by MyChart and the availability of in person appointments.  I also discussed with the patient that there may be a patient responsible charge related to this service. The patient expressed understanding and agreed to proceed.    CHIEF COMPLIANT: Follow-up of left breast LCIS on tamoxifen   INTERVAL HISTORY: Claire Briggs is a 50 y.o. female with above-mentioned history of left breast LCIS who underwent a lumpectomy and is currently on tamoxifen 71m daily. She underwent a left breast excision on 04/29/20 with Dr. BBarry Dienesfor which pathology showed benign fibrocystic changes, small incidental intraductal papilloma and no evidence of malignancy. Breast MRI on 08/15/20 showed no evidence of malignancy bilaterally. She presents over MyChart today for follow-up. With Venlafaxine, her symptoms of hot flashes are much better.  Oncology History  Lobular carcinoma in situ (LCIS) of left breast  05/02/2019 Surgery   Left lumpectomy: Atypical lobular hyperplasia with fibroadenoma   06/2019 -  Anti-estrogen oral therapy   Tamoxifen 121mdaily   04/29/2020 Surgery   Left breast excision x2 (BBarry Dienes benign fibrocystic changes, small incidental intraductal papilloma and no evidence of malignancy     Observations/Objective:  There were no vitals filed for this visit. There is no height or weight on file to calculate BMI.  I have reviewed the data as listed CMP Latest Ref Rng & Units 04/27/2019  Glucose 70 - 99 mg/dL 92  BUN 6 - 20 mg/dL 14  Creatinine 0.44 - 1.00 mg/dL 0.78  Sodium 135 - 145 mmol/L 137  Potassium 3.5 - 5.1 mmol/L 4.0  Chloride 98 - 111 mmol/L 104   CO2 22 - 32 mmol/L 26  Calcium 8.9 - 10.3 mg/dL 8.8(L)    Lab Results  Component Value Date   WBC 6.1 04/27/2019   HGB 13.5 04/27/2019   HCT 41.0 04/27/2019   MCV 95.3 04/27/2019   PLT 297 04/27/2019      Assessment Plan:  Lobular carcinoma in situ (LCIS) of left breast 05/02/2019: Left lumpectomy: Atypical lobular hyperplasia Based uponTyer Cusickcancer risk assessment tool:  10-year risk of breast cancer is10%(average woman's risk 2.5%); lifetime risk 34%(average woman's risk 9.5%) If LCIS was used in this algorithm, lifetime risk increased up to 54%. In 10-year risk increased to 18%.  Risk reduction: Tamoxifen started 06/12/2019 20 mg daily  Tamoxifen toxicities: Patient experienced a hot flashes but is able to tolerate it.  Denies any other new symptoms or concerns.  Breast cancer surveillance: 1.  Mammograms 02/28/2020:Papillary lesions. breast density category B 2.  Breast MRI 08/15/2020: Benign Density Cat B  Left Lumpectomy 04/29/20: UDH, ID Papilloma  Return to clinic in 1 year for follow-up with a MYChart virtual visit after Breast MRI  I discussed the assessment and treatment plan with the patient. The patient was provided an opportunity to ask questions and all were answered. The patient agreed with the plan and demonstrated an understanding of the instructions. The patient was advised to call back or seek an in-person evaluation if the symptoms worsen or if the condition fails to improve as anticipated.   Total time spent: 20 minutes including face-to-face MyChart video visit time and time spent for planning, charting and coordination  of care  Rulon Eisenmenger, MD 09/11/2020   Julious Oka Dorshimer, am acting as scribe for Nicholas Lose, MD.  I have reviewed the above documentation for accuracy and completeness, and I agree with the above.

## 2020-09-11 ENCOUNTER — Inpatient Hospital Stay: Payer: BC Managed Care – PPO | Attending: Hematology and Oncology | Admitting: Hematology and Oncology

## 2020-09-11 DIAGNOSIS — D0502 Lobular carcinoma in situ of left breast: Secondary | ICD-10-CM

## 2020-09-11 MED ORDER — TAMOXIFEN CITRATE 20 MG PO TABS
20.0000 mg | ORAL_TABLET | Freq: Every day | ORAL | 3 refills | Status: DC
Start: 2020-09-11 — End: 2021-07-13

## 2020-09-11 NOTE — Assessment & Plan Note (Signed)
05/02/2019: Left lumpectomy: Atypical lobular hyperplasia Based uponTyer Cusickcancer risk assessment tool:  10-year risk of breast cancer is10%(average woman's risk 2.5%); lifetime risk 34%(average woman's risk 9.5%) If LCIS was used in this algorithm, lifetime risk increased up to 54%. In 10-year risk increased to 18%.  Risk reduction: Tamoxifen started 06/12/2019 20 mg daily  Tamoxifen toxicities: Patient experienced a hot flashes but is able to tolerate it.  Denies any other new symptoms or concerns.  Breast cancer surveillance: 1.  Mammograms 02/28/2020:Papillary lesions. breast density category B 2.  Breast MRI 08/15/2020: Benign Density Cat B  Left Lumpectomy 04/29/20: UDH, ID Papilloma  Return to clinic in 1 year for follow-up

## 2021-02-04 ENCOUNTER — Other Ambulatory Visit: Payer: Self-pay | Admitting: Hematology and Oncology

## 2021-02-04 DIAGNOSIS — Z853 Personal history of malignant neoplasm of breast: Secondary | ICD-10-CM

## 2021-02-04 DIAGNOSIS — Z1231 Encounter for screening mammogram for malignant neoplasm of breast: Secondary | ICD-10-CM

## 2021-04-03 ENCOUNTER — Other Ambulatory Visit: Payer: Self-pay

## 2021-04-03 ENCOUNTER — Ambulatory Visit
Admission: RE | Admit: 2021-04-03 | Discharge: 2021-04-03 | Disposition: A | Discharge status: Home / Self Care | Payer: BC Managed Care – PPO | Source: Ambulatory Visit | Attending: Hematology and Oncology | Admitting: Hematology and Oncology

## 2021-04-03 DIAGNOSIS — Z1231 Encounter for screening mammogram for malignant neoplasm of breast: Secondary | ICD-10-CM

## 2021-04-03 DIAGNOSIS — Z853 Personal history of malignant neoplasm of breast: Secondary | ICD-10-CM

## 2021-04-29 ENCOUNTER — Other Ambulatory Visit: Payer: Self-pay | Admitting: Hematology and Oncology

## 2021-07-11 ENCOUNTER — Other Ambulatory Visit: Payer: Self-pay | Admitting: Hematology and Oncology

## 2021-07-29 ENCOUNTER — Encounter: Payer: Self-pay | Admitting: Hematology and Oncology

## 2021-08-28 ENCOUNTER — Other Ambulatory Visit: Payer: Self-pay

## 2021-08-28 ENCOUNTER — Ambulatory Visit
Admission: RE | Admit: 2021-08-28 | Discharge: 2021-08-28 | Disposition: A | Discharge status: Home / Self Care | Payer: BC Managed Care – PPO | Source: Ambulatory Visit | Attending: Hematology and Oncology | Admitting: Hematology and Oncology

## 2021-08-28 DIAGNOSIS — D0502 Lobular carcinoma in situ of left breast: Secondary | ICD-10-CM

## 2021-08-28 MED ORDER — GADOBUTROL 1 MMOL/ML IV SOLN
8.0000 mL | Freq: Once | INTRAVENOUS | Status: AC | PRN
  Administered 2021-08-28: 8 mL via INTRAVENOUS

## 2021-08-31 ENCOUNTER — Telehealth: Payer: Self-pay

## 2021-08-31 NOTE — Telephone Encounter (Signed)
-----   Message from Gardenia Phlegm, NP sent at 08/31/2021  8:09 AM EST ----- Please notify patient of the negative results  ----- Message ----- From: Interface, Rad Results In Sent: 08/28/2021   6:47 PM EST To: Nicholas Lose, MD

## 2021-08-31 NOTE — Telephone Encounter (Signed)
Called and spoke with pt, per [facility] MRI is negative.  Pt verbalized understanding and thanks

## 2021-09-09 ENCOUNTER — Encounter (HOSPITAL_COMMUNITY): Payer: Self-pay

## 2021-09-10 NOTE — Progress Notes (Signed)
HEMATOLOGY-ONCOLOGY MYCHART VIDEO VISIT PROGRESS NOTE  I connected with Claire Briggs on 09/11/2021 at  8:45 AM EST by MyChart video conference and verified that I am speaking with the correct person using two identifiers.  I discussed the limitations, risks, security and privacy concerns of performing an evaluation and management service by MyChart and the availability of in person appointments.  I also discussed with the patient that there may be a patient responsible charge related to this service. The patient expressed understanding and agreed to proceed.  Patient's Location: Home Physician Location: Clinic  CHIEF COMPLIANT: Follow-up of left breast LCIS on tamoxifen   INTERVAL HISTORY: Claire Briggs is a 51 y.o. female with above-mentioned history of left breast LCIS who underwent a lumpectomy and is currently on tamoxifen 41m daily. MRI Breast on 08/28/2021 showed no evidence of malignancy. She presents via MyChart today for follow-up.   Oncology History  Lobular carcinoma in situ (LCIS) of left breast  05/02/2019 Surgery   Left lumpectomy: Atypical lobular hyperplasia with fibroadenoma   06/2019 -  Anti-estrogen oral therapy   Tamoxifen 132mdaily   04/29/2020 Surgery   Left breast excision x2 (BBarry Dienes benign fibrocystic changes, small incidental intraductal papilloma and no evidence of malignancy     Observations/Objective:  There were no vitals filed for this visit. There is no height or weight on file to calculate BMI.  I have reviewed the data as listed CMP Latest Ref Rng & Units 04/27/2019  Glucose 70 - 99 mg/dL 92  BUN 6 - 20 mg/dL 14  Creatinine 0.44 - 1.00 mg/dL 0.78  Sodium 135 - 145 mmol/L 137  Potassium 3.5 - 5.1 mmol/L 4.0  Chloride 98 - 111 mmol/L 104  CO2 22 - 32 mmol/L 26  Calcium 8.9 - 10.3 mg/dL 8.8(L)    Lab Results  Component Value Date   WBC 6.1 04/27/2019   HGB 13.5 04/27/2019   HCT 41.0 04/27/2019   MCV 95.3 04/27/2019   PLT 297 04/27/2019       Assessment Plan:  Lobular carcinoma in situ (LCIS) of left breast 05/02/2019: Left lumpectomy: Atypical lobular hyperplasia Based upon Tyer Cusick cancer risk assessment tool:  10-year risk of breast cancer is 10% (average woman's risk 2.5%);  lifetime risk 34% (average woman's risk 9.5%) If LCIS was used in this algorithm, lifetime risk increased up to 54%.  In 10-year risk increased to 18%.   Risk reduction: Tamoxifen started 06/12/2019 20 mg daily dose reduced to 10 mg daily 09/11/2021   Tamoxifen toxicities: Patient experienced a hot flashes but is able to tolerate it.  Denies any other new symptoms or concerns. We hope that the reduced dosage of tamoxifen the hot flashes would not be as severe.  Breast cancer surveillance: 1.  Mammograms 02/28/2020:Papillary lesions. breast density category B 2.  Breast MRI 08/28/2021: Benign Density Cat B, I recommended that we do MRIs every other year.  We will do the next MRI in 2025   Left Lumpectomy 04/29/20: UDH, ID Papilloma   Return to clinic in 1 year for follow-up     I discussed the assessment and treatment plan with the patient. The patient was provided an opportunity to ask questions and all were answered. The patient agreed with the plan and demonstrated an understanding of the instructions. The patient was advised to call back or seek an in-person evaluation if the symptoms worsen or if the condition fails to improve as anticipated.   Total time spent: 20  minutes including face-to-face MyChart video visit time and time spent for planning, charting and coordination of care  Rulon Eisenmenger, MD 09/11/2021  I, Thana Ates am acting as scribe for Nicholas Lose, MD.  I have reviewed the above documentation for accuracy and completeness, and I agree with the above.

## 2021-09-10 NOTE — Assessment & Plan Note (Signed)
05/02/2019: Left lumpectomy: Atypical lobular hyperplasia Based uponTyer Cusickcancer risk assessment tool:  10-year risk of breast cancer is10%(average woman's risk 2.5%); lifetime risk 34%(average woman's risk 9.5%) If LCIS was used in this algorithm, lifetime risk increased up to 54%. In 10-year risk increased to 18%.  Risk reduction: Tamoxifen started 11/10/202020 mg daily  Tamoxifen toxicities: Patient experienced a hot flashes but is able to tolerate it. Denies any other new symptoms or concerns.  Breast cancer surveillance: 1.Mammograms 02/28/2020:Papillary lesions. breast density category B 2.Breast MRI 714/2023: Benign Density Cat B  Left Lumpectomy 04/29/20: UDH, ID Papilloma  Return to clinic in 1 year for follow-up

## 2021-09-11 ENCOUNTER — Inpatient Hospital Stay: Payer: BC Managed Care – PPO | Attending: Hematology and Oncology | Admitting: Hematology and Oncology

## 2021-09-11 DIAGNOSIS — Z79810 Long term (current) use of selective estrogen receptor modulators (SERMs): Secondary | ICD-10-CM | POA: Insufficient documentation

## 2021-09-11 DIAGNOSIS — D0502 Lobular carcinoma in situ of left breast: Secondary | ICD-10-CM | POA: Insufficient documentation

## 2021-09-11 MED ORDER — TAMOXIFEN CITRATE 10 MG PO TABS: 10.0000 mg | ORAL_TABLET | Freq: Every day | ORAL | 3 refills | Status: DC

## 2022-03-08 ENCOUNTER — Other Ambulatory Visit: Payer: Self-pay | Admitting: Hematology and Oncology

## 2022-03-08 DIAGNOSIS — Z1231 Encounter for screening mammogram for malignant neoplasm of breast: Secondary | ICD-10-CM

## 2022-04-09 ENCOUNTER — Ambulatory Visit
Admission: RE | Admit: 2022-04-09 | Discharge: 2022-04-09 | Disposition: A | Discharge status: Home / Self Care | Payer: BC Managed Care – PPO | Source: Ambulatory Visit | Attending: Hematology and Oncology | Admitting: Hematology and Oncology

## 2022-04-09 DIAGNOSIS — Z1231 Encounter for screening mammogram for malignant neoplasm of breast: Secondary | ICD-10-CM

## 2022-07-24 LAB — COLOGUARD: COLOGUARD: NEGATIVE

## 2022-09-14 ENCOUNTER — Telehealth: Payer: Self-pay | Admitting: Hematology and Oncology

## 2022-09-14 NOTE — Telephone Encounter (Signed)
Scheduled patient per 3/13 IB Patient scheduled and confirmed.

## 2022-09-27 NOTE — Progress Notes (Signed)
   Patient Care Team: Madison Hickman, FNP as PCP - General (Family Medicine)  DIAGNOSIS: No diagnosis found.  SUMMARY OF ONCOLOGIC HISTORY: Oncology History  Lobular carcinoma in situ (LCIS) of left breast  05/02/2019 Surgery   Left lumpectomy: Atypical lobular hyperplasia with fibroadenoma   06/2019 -  Anti-estrogen oral therapy   Tamoxifen 26m daily   04/29/2020 Surgery   Left breast excision x2 (Barry Dienes: benign fibrocystic changes, small incidental intraductal papilloma and no evidence of malignancy     CHIEF COMPLIANT: Follow-up of left breast LCIS on tamoxifen     INTERVAL HISTORY: Claire GIUFFRIDAis a 52y.o. female with above-mentioned history of left breast LCIS who underwent a lumpectomy and is currently on tamoxifen 133mdaily. She presents to the clinic for a follow-up.    ALLERGIES:  has No Known Allergies.  MEDICATIONS:  Current Outpatient Medications  Medication Sig Dispense Refill   aspirin EC 81 MG tablet Take 81 mg by mouth daily.     atorvastatin (LIPITOR) 40 MG tablet Take 40 mg by mouth every evening.  3   Cholecalciferol (VITAMIN D3) 50 MCG (2000 UT) TABS Take 2,000 Units by mouth daily.     levothyroxine (SYNTHROID, LEVOTHROID) 50 MCG tablet Take 50 mcg by mouth daily before breakfast.  2   lisinopril (ZESTRIL) 5 MG tablet TAKE 1 TABLET BY MOUTH ONCE (1) DAILY 90 tablet 0   tamoxifen (NOLVADEX) 10 MG tablet Take 1 tablet (10 mg total) by mouth daily. 90 tablet 3   venlafaxine XR (EFFEXOR-XR) 75 MG 24 hr capsule Take 75 mg by mouth at bedtime.     No current facility-administered medications for this visit.    PHYSICAL EXAMINATION: ECOG PERFORMANCE STATUS: {CHL ONC ECOG [phone]}  There were no vitals filed for this visit. There were no vitals filed for this visit.  BREAST:*** No palpable masses or nodules in either right or left breasts. No palpable axillary supraclavicular or infraclavicular adenopathy no breast tenderness or nipple discharge.  (exam performed in the presence of a chaperone)  LABORATORY DATA:  I have reviewed the data as listed    Latest Ref Rng & Units 04/27/2019    8:34 AM  CMP  Glucose 70 - 99 mg/dL 92   BUN 6 - 20 mg/dL 14   Creatinine 0.44 - 1.00 mg/dL 0.78   Sodium 135 - 145 mmol/L 137   Potassium 3.5 - 5.1 mmol/L 4.0   Chloride 98 - 111 mmol/L 104   CO2 22 - 32 mmol/L 26   Calcium 8.9 - 10.3 mg/dL 8.8     Lab Results  Component Value Date   WBC 6.1 04/27/2019   HGB 13.5 04/27/2019   HCT 41.0 04/27/2019   MCV 95.3 04/27/2019   PLT 297 04/27/2019    ASSESSMENT & PLAN:  No problem-specific Assessment & Plan notes found for this encounter.    No orders of the defined types were placed in this encounter.  The patient has a good understanding of the overall plan. she agrees with it. she will call with any problems that may develop before the next visit here. Total time spent: 30 mins including face to face time and time spent for planning, charting and co-ordination of care   DeSuzzette RighterCMMonrovia2/26/24    I DeGardiner Coinsm acting as a scEducation administratoror [employer]***

## 2022-09-29 ENCOUNTER — Inpatient Hospital Stay: Payer: BC Managed Care – PPO | Attending: Hematology and Oncology | Admitting: Hematology and Oncology

## 2022-09-29 VITALS — BP 113/66 | HR 88 | Temp 97.8°F | Resp 18 | Ht 66.0 in | Wt 185.4 lb

## 2022-09-29 DIAGNOSIS — Z79899 Other long term (current) drug therapy: Secondary | ICD-10-CM | POA: Diagnosis not present

## 2022-09-29 DIAGNOSIS — Z79810 Long term (current) use of selective estrogen receptor modulators (SERMs): Secondary | ICD-10-CM | POA: Insufficient documentation

## 2022-09-29 DIAGNOSIS — D0502 Lobular carcinoma in situ of left breast: Secondary | ICD-10-CM | POA: Diagnosis present

## 2022-09-29 NOTE — Assessment & Plan Note (Signed)
05/02/2019: Left lumpectomy: Atypical lobular hyperplasia Based upon Tyer Cusick cancer risk assessment tool:  10-year risk of breast cancer is 10% (average woman's risk 2.5%);  lifetime risk 34% (average woman's risk 9.5%) If LCIS was used in this algorithm, lifetime risk increased up to 54%.  In 10-year risk increased to 18%.   Risk reduction: Tamoxifen started 06/12/2019 20 mg daily dose reduced to 10 mg daily 09/11/2021   Tamoxifen toxicities: Patient experienced a hot flashes but is able to tolerate it.  Denies any other new symptoms or concerns. We hope that the reduced dosage of tamoxifen the hot flashes would not be as severe.   Breast cancer surveillance: 1.  Mammograms 04/12/2022:Benign breast density category B 2.  Breast MRI 08/28/21: Benign Density Cat B, I recommended that we do MRIs every other year.  We will do the next MRI in 2025   Left Lumpectomy 04/29/20: UDH, ID Papilloma   Return to clinic in 1 year for follow-up

## 2022-10-08 ENCOUNTER — Other Ambulatory Visit: Payer: Self-pay | Admitting: Hematology and Oncology

## 2022-10-08 DIAGNOSIS — Z1231 Encounter for screening mammogram for malignant neoplasm of breast: Secondary | ICD-10-CM

## 2022-10-14 ENCOUNTER — Other Ambulatory Visit: Payer: Self-pay | Admitting: Hematology and Oncology

## 2023-04-15 ENCOUNTER — Ambulatory Visit: Payer: BC Managed Care – PPO

## 2023-04-15 ENCOUNTER — Ambulatory Visit
Admission: RE | Admit: 2023-04-15 | Discharge: 2023-04-15 | Disposition: A | Discharge status: Home / Self Care | Payer: BC Managed Care – PPO | Source: Ambulatory Visit | Attending: Hematology and Oncology

## 2023-04-15 DIAGNOSIS — Z1231 Encounter for screening mammogram for malignant neoplasm of breast: Secondary | ICD-10-CM

## 2023-07-12 ENCOUNTER — Ambulatory Visit
Admission: RE | Admit: 2023-07-12 | Discharge: 2023-07-12 | Disposition: A | Discharge status: Home / Self Care | Payer: BC Managed Care – PPO | Source: Ambulatory Visit | Attending: Hematology and Oncology | Admitting: Hematology and Oncology

## 2023-07-12 DIAGNOSIS — D0502 Lobular carcinoma in situ of left breast: Secondary | ICD-10-CM

## 2023-07-12 MED ORDER — GADOPICLENOL 0.5 MMOL/ML IV SOLN
8.0000 mL | Freq: Once | INTRAVENOUS | Status: AC | PRN
  Administered 2023-07-12: 8 mL via INTRAVENOUS

## 2023-09-05 IMAGING — MR MR BREAST BILAT WO/W CM
8 of 12 series · 33 of 48 positions shown · IV contrast (gadavist)
Comparison: August 15, 2020

CLINICAL DATA: Status post resection of LCIS May 02, 2019 and
excision of left papilloma April 30, 2019. Lifetime risk of
breast cancer calculated at 30 5%.

EXAM:
BILATERAL BREAST MRI WITH AND WITHOUT CONTRAST
TECHNIQUE: Multiplanar, multisequence MR images of both breasts were obtained
prior to and following the intravenous administration of 8 ml of
Gadavist

[Series 2: t2_tirm_tra ipat (a-p) · axial · 3.0mm · 0.70mm/px · 1 of 55 slices shown]
[im 1/55]
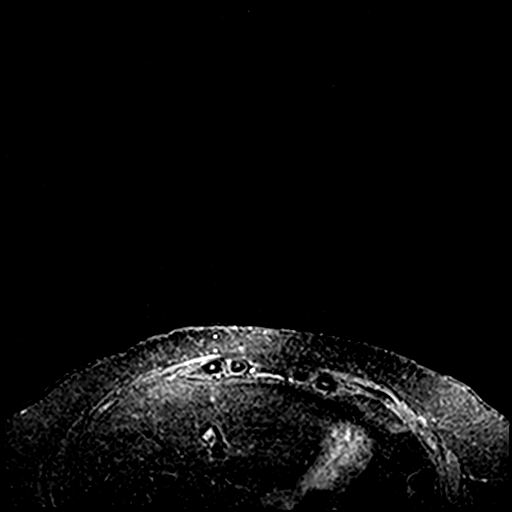

[Series 3: fl3d pre-cm no · axial · non-contrast · 1.2mm · 0.94mm/px · z∈[-81,+91]mm · 5 of 144 slices shown]
[im 1/144]
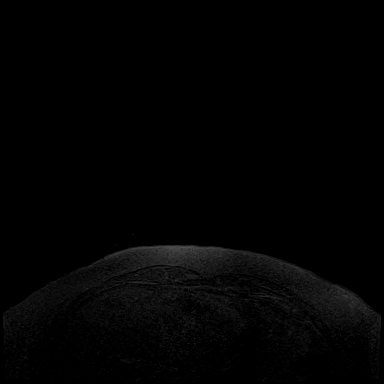
[im 36/144]
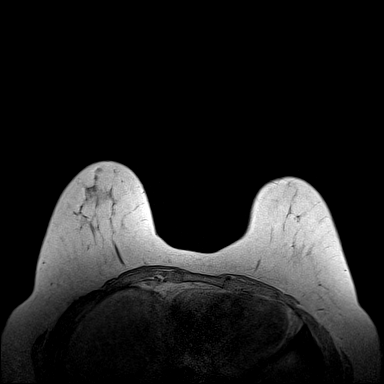
[im 72/144]
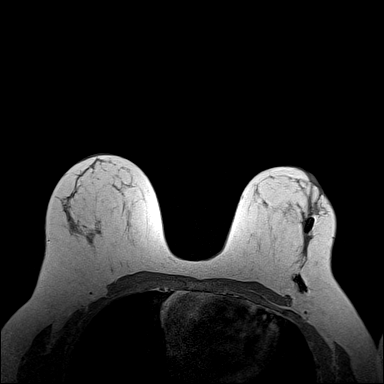
[im 108/144]
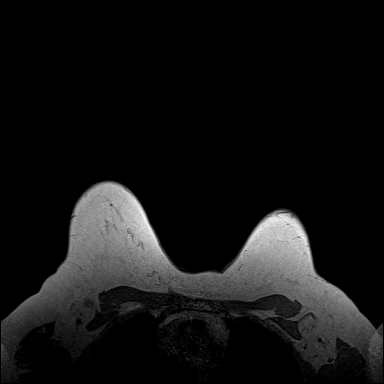
[im 144/144]
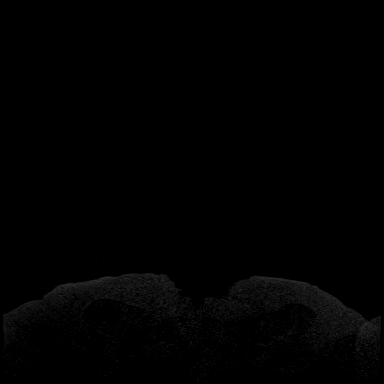

[Series 4: fl3d pre-cm · axial · non-contrast · 1.2mm · 0.94mm/px · z∈[-81,+91]mm · 5 of 144 slices shown]
[im 1/144]
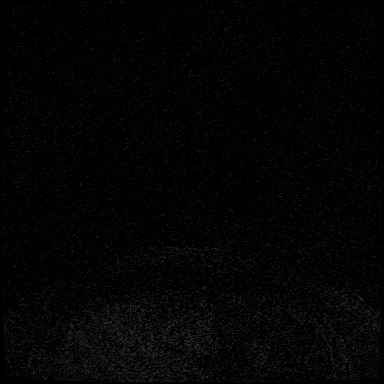
[im 36/144]
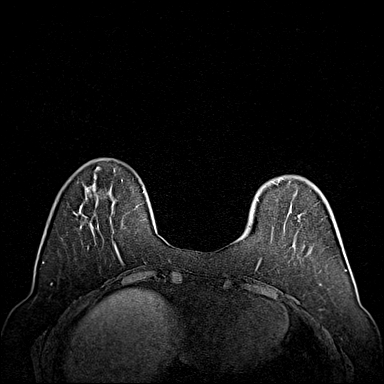
[im 72/144]
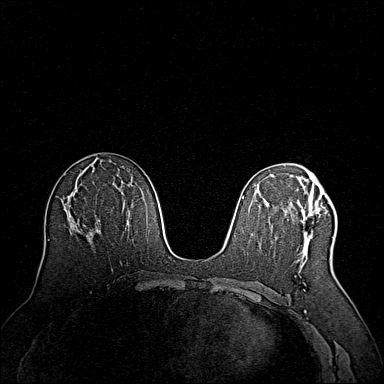
[im 108/144]
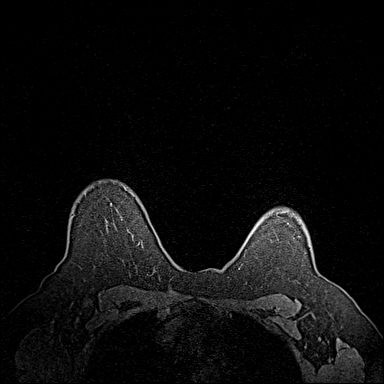
[im 144/144]
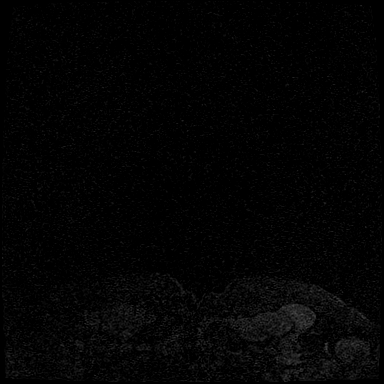

[Series 5: fl3d post immediate · axial · 1.2mm · 0.94mm/px · z∈[-81,+91]mm · 5 of 144 slices shown (1 of 3)]
[im 1/144]
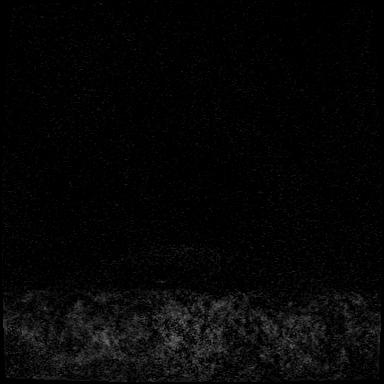
[im 36/144]
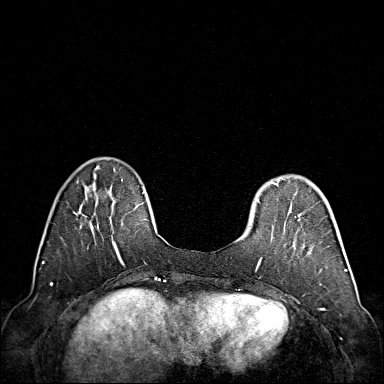
[im 72/144]
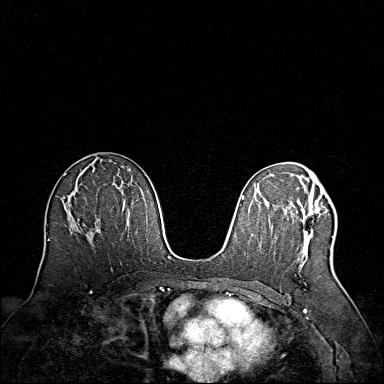
[im 108/144]
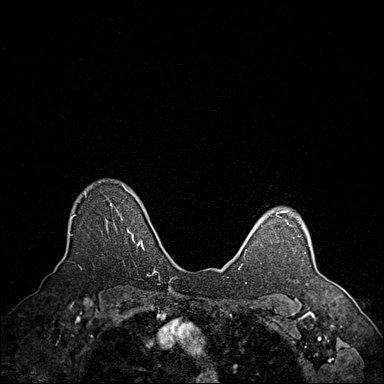
[im 144/144]
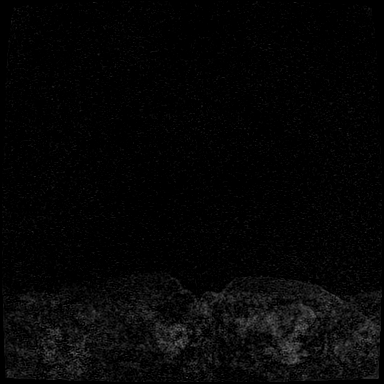

[Series 6: fl3d post immediate · axial · 1.2mm · 0.94mm/px · z∈[-81,+91]mm · 5 of 144 slices shown (2 of 3)]
[im 1/144]
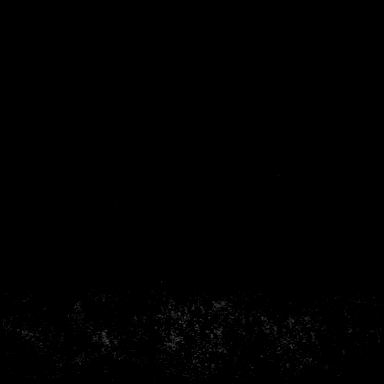
[im 36/144]
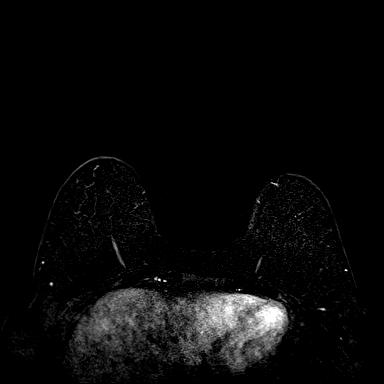
[im 72/144]
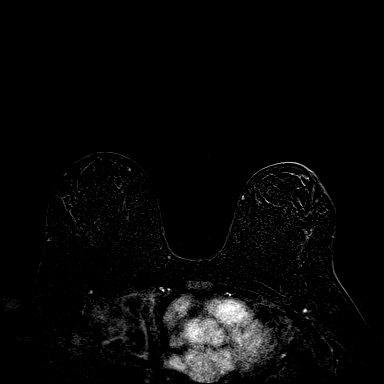
[im 108/144]
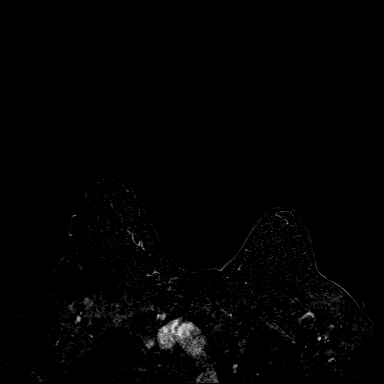
[im 144/144]
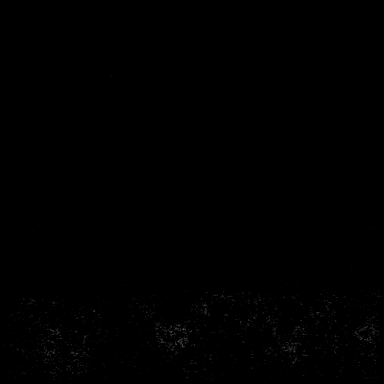

[Series 7: fl3d post immediate · axial · 172.8mm · 0.94mm/px · 1 of 1 slices shown (3 of 3)]
[im 1/1]
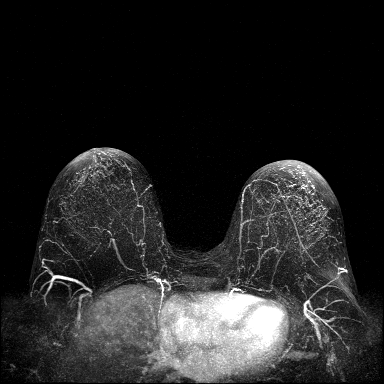

[Series 8: fl3d post 3min · axial · 1.2mm · 0.94mm/px · z∈[-81,+91]mm · 6 of 144 slices shown]
[im 1/144]
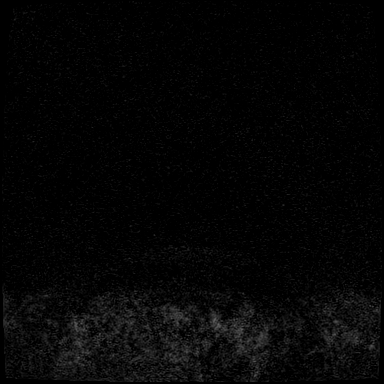
[im 29/144]
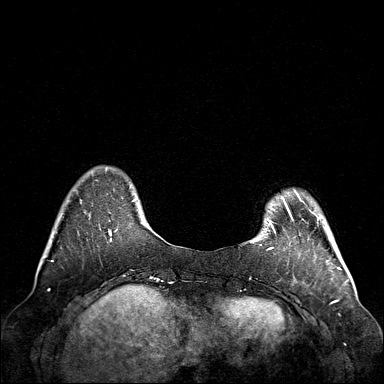
[im 58/144]
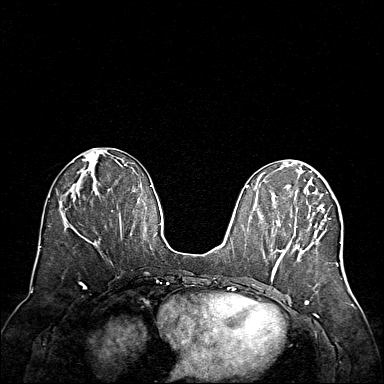
[im 86/144]
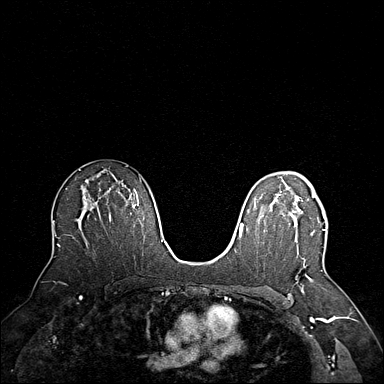
[im 115/144]
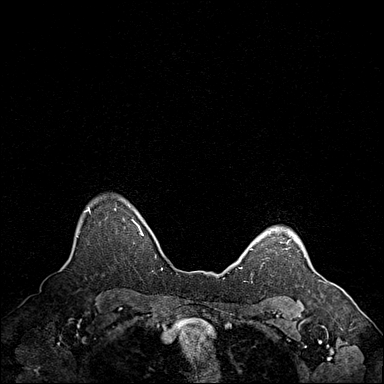
[im 144/144]
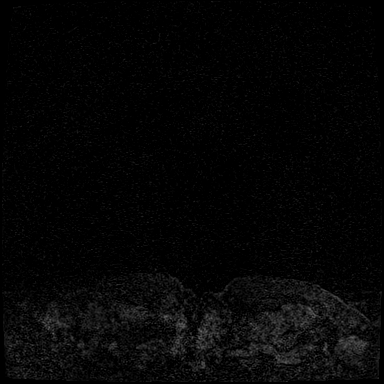

[Series 9: fl3d post 3min_sub · axial · 1.2mm · 0.94mm/px · z∈[-81,+56]mm · 5 of 144 slices shown]
[im 1/144]
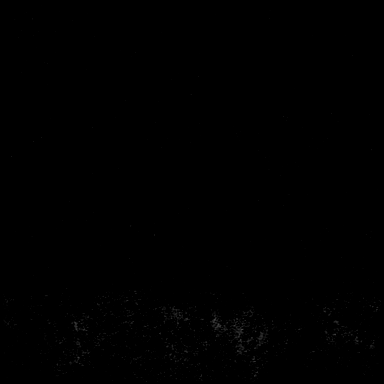
[im 29/144]
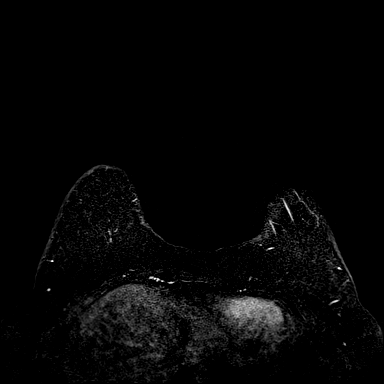
[im 58/144]
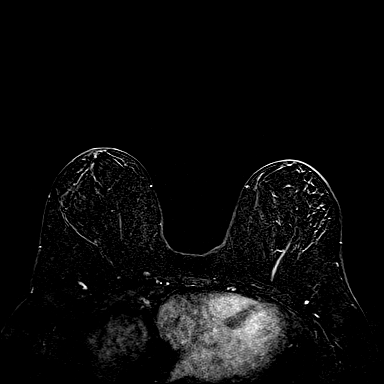
[im 86/144]
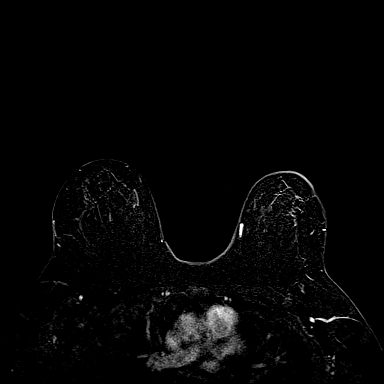
[im 115/144]
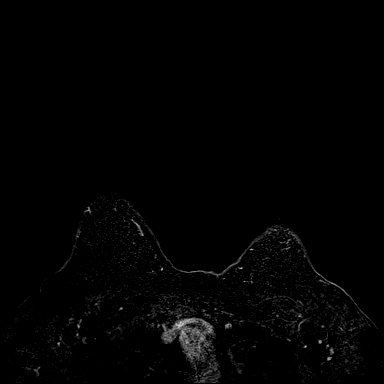

[33 of 48 positions shown; findings below may reference images not displayed]

Three-dimensional MR images were rendered by post-processing of the
original MR data on an independent workstation. The
three-dimensional MR images were interpreted, and findings are
reported in the following complete MRI report for this study. Three
dimensional images were evaluated at the independent interpreting
workstation using the DynaCAD thin client.
FINDINGS: Breast composition: b. Scattered fibroglandular tissue.

Background parenchymal enhancement: Mild

Right breast: No mass or abnormal enhancement.

Left breast: Postsurgical changes in the outer left breast. No
evidence of malignancy.

Lymph nodes: No abnormal appearing lymph nodes.

Ancillary findings:  None.
IMPRESSION: No MRI evidence of malignancy.

RECOMMENDATION:
1. Bilateral screening mammography due in April 2022.
2. Bilateral screening breast MRI in 1 year due to high risk status.

BI-RADS CATEGORY  2: Benign.

## 2023-12-03 ENCOUNTER — Other Ambulatory Visit: Payer: Self-pay | Admitting: Hematology and Oncology

## 2023-12-05 ENCOUNTER — Telehealth: Payer: Self-pay

## 2023-12-05 NOTE — Telephone Encounter (Signed)
 Pt was not scheduled for annual f/u. She was scheduled for 01/02/24 at 3:30 PM. She is agreeable

## 2024-01-02 ENCOUNTER — Inpatient Hospital Stay: Payer: Self-pay | Attending: Hematology and Oncology | Admitting: Hematology and Oncology

## 2024-01-02 VITALS — BP 115/76 | HR 81 | Temp 97.5°F | Resp 18 | Ht 66.0 in | Wt 169.5 lb

## 2024-01-02 DIAGNOSIS — Z79810 Long term (current) use of selective estrogen receptor modulators (SERMs): Secondary | ICD-10-CM | POA: Insufficient documentation

## 2024-01-02 DIAGNOSIS — D0502 Lobular carcinoma in situ of left breast: Secondary | ICD-10-CM | POA: Diagnosis not present

## 2024-01-02 NOTE — Assessment & Plan Note (Signed)
 05/02/2019: Left lumpectomy: Atypical lobular hyperplasia Based upon Tyer Cusick cancer risk assessment tool:  10-year risk of breast cancer is 10% (average woman's risk 2.5%);  lifetime risk 34% (average woman's risk 9.5%) If LCIS was used in this algorithm, lifetime risk increased up to 54%.  In 10-year risk increased to 18%.   Risk reduction: Tamoxifen  started 06/12/2019 20 mg daily dose reduced to 10 mg daily 09/11/2021 Patient underwent hysterectomy January 2024 because of heavy uterine bleeding.  Pathology was benign.   Tamoxifen  toxicities: Tolerating 10 mg dosage of tamoxifen  extremely well.  She will continue this for total of 5 years.   Breast cancer surveillance: 1.  Mammograms 04/18/2023:Benign breast density category B 2.  Breast MRI 07/12/2023: Benign Density Cat B, I recommended that we do MRIs every other year.  Therefore we will do MRIs in the next month  Left Lumpectomy 04/29/20: UDH, ID Papilloma   Return to clinic in 1 year for follow-up

## 2024-01-02 NOTE — Progress Notes (Signed)
 Patient Care Team: Alveta Joiner, FNP as PCP - General (Family Medicine)  DIAGNOSIS:  Encounter Diagnosis  Name Primary?   Lobular carcinoma in situ (LCIS) of left breast Yes    SUMMARY OF ONCOLOGIC HISTORY: Oncology History  Lobular carcinoma in situ (LCIS) of left breast  05/02/2019 Surgery   Left lumpectomy: Atypical lobular hyperplasia with fibroadenoma   06/2019 -  Anti-estrogen oral therapy   Tamoxifen  10mg  daily   04/29/2020 Surgery   Left breast excision x2 Cherlynn Cornfield): benign fibrocystic changes, small incidental intraductal papilloma and no evidence of malignancy     CHIEF COMPLIANT: Follow-up on tamoxifen  therapy  HISTORY OF PRESENT ILLNESS:  History of Present Illness Claire Briggs is a 53 year old female with lobular carcinoma in situ who presents for follow-up on tamoxifen  therapy.  She has been on tamoxifen  since late 2020 and is nearing the end of her treatment. She recently refilled her prescription with a 90-day supply. Initially, she experienced significant hot flashes, which resolved after discontinuing venlafaxine six months ago.  Her mammograms and MRIs have been consistently clear, with breast density decreasing from category C to B. Her last MRI was in December, and her next mammogram is scheduled for September.  No current pain or discomfort. Initial hot flashes with tamoxifen  have resolved.     ALLERGIES:  has no known allergies.  MEDICATIONS:  Current Outpatient Medications  Medication Sig Dispense Refill   aspirin  EC 81 MG tablet Take 81 mg by mouth daily.     Cholecalciferol (VITAMIN D3) 50 MCG (2000 UT) TABS Take 2,000 Units by mouth daily.     levothyroxine  (SYNTHROID , LEVOTHROID) 50 MCG tablet Take 50 mcg by mouth daily before breakfast.  2   lisinopril  (ZESTRIL ) 5 MG tablet TAKE 1 TABLET BY MOUTH ONCE (1) DAILY 90 tablet 0   tamoxifen  (NOLVADEX ) 10 MG tablet TAKE 1 TABLET BY MOUTH ONCE DAILY 90 tablet 3   No current  facility-administered medications for this visit.    PHYSICAL EXAMINATION: ECOG PERFORMANCE STATUS: 1 - Symptomatic but completely ambulatory  Vitals:   01/02/24 1542  BP: 115/76  Pulse: 81  Resp: 18  Temp: (!) 97.5 F (36.4 C)  SpO2: 100%   Filed Weights   01/02/24 1542  Weight: 169 lb 8 oz (76.9 kg)     LABORATORY DATA:  I have reviewed the data as listed    Latest Ref Rng & Units 04/27/2019    8:34 AM  CMP  Glucose 70 - 99 mg/dL 92   BUN 6 - 20 mg/dL 14   Creatinine 9.14 - 1.00 mg/dL 7.82   Sodium 956 - 213 mmol/L 137   Potassium 3.5 - 5.1 mmol/L 4.0   Chloride 98 - 111 mmol/L 104   CO2 22 - 32 mmol/L 26   Calcium  8.9 - 10.3 mg/dL 8.8     Lab Results  Component Value Date   WBC 6.1 04/27/2019   HGB 13.5 04/27/2019   HCT 41.0 04/27/2019   MCV 95.3 04/27/2019   PLT 297 04/27/2019    ASSESSMENT & PLAN:  Lobular carcinoma in situ (LCIS) of left breast 05/02/2019: Left lumpectomy: Atypical lobular hyperplasia Based upon [employer] [place] cancer risk assessment tool:  10-year risk of breast cancer is 10% (average woman's risk 2.5%);  lifetime risk 34% (average woman's risk 9.5%) If LCIS was used in this algorithm, lifetime risk increased up to 54%.  In 10-year risk increased to 18%.   Risk reduction: Tamoxifen  started  06/12/2019 20 mg daily dose reduced to 10 mg daily 09/11/2021 Patient underwent hysterectomy January 2024 because of heavy uterine bleeding.  Pathology was benign.   Tamoxifen  toxicities: Tolerating 10 mg dosage of tamoxifen  extremely well.  She will continue this for total of 5 years.   Breast cancer surveillance: 1.  Mammograms 04/18/2023:Benign breast density category B 2.  Breast MRI 07/12/2023: Benign Density Cat B,   We discussed different options for surveillance long-term.  Given that the breast density is a category B, I recommended that we perform contrast-enhanced mammograms instead of mammogram alternating with breast MRIs.  I sent an order  for the contrast-enhanced mammogram to be done in September.  Left Lumpectomy 04/29/20: [facility], ID Papilloma   Return to clinic in 1 year for follow-up with a telephone visit to make sure that we order the CEM's appropriately.  No orders of the defined types were placed in this encounter.  The patient has a good understanding of the overall plan. she agrees with it. she will call with any problems that may develop before the next visit here. Total time spent: 30 mins including face to face time and time spent for planning, charting and co-ordination of care   Margert Sheerer, MD 01/02/24

## 2024-04-27 ENCOUNTER — Ambulatory Visit
Admission: RE | Admit: 2024-04-27 | Discharge: 2024-04-27 | Disposition: A | Discharge status: Home / Self Care | Source: Ambulatory Visit | Attending: Hematology and Oncology | Admitting: Hematology and Oncology

## 2024-04-27 DIAGNOSIS — D0502 Lobular carcinoma in situ of left breast: Secondary | ICD-10-CM

## 2024-04-27 MED ORDER — IOPAMIDOL (ISOVUE-370) INJECTION 76%
100.0000 mL | Freq: Once | INTRAVENOUS | Status: AC | PRN
  Administered 2024-04-27: 100 mL via INTRAVENOUS
